# Patient Record
Sex: Female | Born: 1935 | Race: White | Hispanic: No | Marital: Single | Smoking: Never smoker
Health system: Southern US, Community
[De-identification: ages and names within clinical notes are randomized; demographics above are authoritative.]

## PROBLEM LIST (undated history)

## (undated) DIAGNOSIS — A419 Sepsis, unspecified organism: Secondary | ICD-10-CM

## (undated) DIAGNOSIS — H548 Legal blindness, as defined in USA: Secondary | ICD-10-CM

## (undated) DIAGNOSIS — I10 Essential (primary) hypertension: Secondary | ICD-10-CM

## (undated) DIAGNOSIS — H3530 Unspecified macular degeneration: Secondary | ICD-10-CM

## (undated) DIAGNOSIS — I739 Peripheral vascular disease, unspecified: Secondary | ICD-10-CM

## (undated) DIAGNOSIS — C801 Malignant (primary) neoplasm, unspecified: Secondary | ICD-10-CM

## (undated) DIAGNOSIS — I2510 Atherosclerotic heart disease of native coronary artery without angina pectoris: Secondary | ICD-10-CM

## (undated) DIAGNOSIS — E785 Hyperlipidemia, unspecified: Secondary | ICD-10-CM

## (undated) HISTORY — PX: EYE SURGERY: SHX253

## (undated) HISTORY — PX: CORONARY ANGIOPLASTY WITH STENT PLACEMENT: SHX49

## (undated) HISTORY — PX: ABDOMINAL HYSTERECTOMY: SHX81

## (undated) HISTORY — PX: APPENDECTOMY: SHX54

## (undated) HISTORY — PX: EYE MUSCLE SURGERY: SHX370

---

## 2004-02-24 ENCOUNTER — Ambulatory Visit: Payer: Self-pay | Admitting: Internal Medicine

## 2005-10-25 HISTORY — PX: CORONARY ANGIOPLASTY WITH STENT PLACEMENT: SHX49

## 2005-11-19 ENCOUNTER — Ambulatory Visit: Payer: Self-pay | Admitting: Internal Medicine

## 2006-01-12 ENCOUNTER — Encounter: Payer: Self-pay | Admitting: Internal Medicine

## 2006-01-25 ENCOUNTER — Encounter: Payer: Self-pay | Admitting: Internal Medicine

## 2006-02-17 ENCOUNTER — Ambulatory Visit: Payer: Self-pay | Admitting: Internal Medicine

## 2006-02-25 ENCOUNTER — Encounter: Payer: Self-pay | Admitting: Internal Medicine

## 2006-03-26 ENCOUNTER — Encounter: Payer: Self-pay | Admitting: Internal Medicine

## 2006-04-26 ENCOUNTER — Encounter: Payer: Self-pay | Admitting: Internal Medicine

## 2007-02-20 ENCOUNTER — Ambulatory Visit: Payer: Self-pay | Admitting: Internal Medicine

## 2009-03-24 ENCOUNTER — Ambulatory Visit: Payer: Self-pay | Admitting: Internal Medicine

## 2011-02-23 ENCOUNTER — Ambulatory Visit: Payer: Self-pay | Admitting: Internal Medicine

## 2012-07-08 ENCOUNTER — Ambulatory Visit: Payer: Self-pay | Admitting: Family Medicine

## 2012-07-08 LAB — CBC WITH DIFFERENTIAL/PLATELET
Basophil %: 1.1 %
Eosinophil #: 0.1 10*3/uL (ref 0.0–0.7)
Eosinophil %: 1.9 %
HGB: 13.9 g/dL (ref 12.0–16.0)
Lymphocyte #: 1.3 10*3/uL (ref 1.0–3.6)
Lymphocyte %: 16.6 %
MCH: 30 pg (ref 26.0–34.0)
MCHC: 34.1 g/dL (ref 32.0–36.0)
MCV: 88 fL (ref 80–100)
Neutrophil #: 5.6 10*3/uL (ref 1.4–6.5)
Neutrophil %: 73.1 %
Platelet: 222 10*3/uL (ref 150–440)
RBC: 4.65 10*6/uL (ref 3.80–5.20)
RDW: 12.9 % (ref 11.5–14.5)
WBC: 7.7 10*3/uL (ref 3.6–11.0)

## 2012-07-08 LAB — URINALYSIS, COMPLETE
Bacteria: NEGATIVE
Ketone: NEGATIVE
Nitrite: NEGATIVE
Ph: 8 (ref 4.5–8.0)
Specific Gravity: 1.005 (ref 1.003–1.030)

## 2012-07-08 LAB — COMPREHENSIVE METABOLIC PANEL
Alkaline Phosphatase: 88 U/L (ref 50–136)
BUN: 13 mg/dL (ref 7–18)
Creatinine: 0.67 mg/dL (ref 0.60–1.30)
EGFR (African American): >60
EGFR (Non-African Amer.): >60
Osmolality: 278 (ref 275–301)
SGOT(AST): 23 U/L (ref 15–37)
SGPT (ALT): 29 U/L (ref 12–78)
Sodium: 139 mmol/L (ref 136–145)

## 2012-07-14 ENCOUNTER — Ambulatory Visit: Payer: Self-pay | Admitting: Emergency Medicine

## 2013-02-23 ENCOUNTER — Ambulatory Visit: Payer: Self-pay | Admitting: Internal Medicine

## 2013-10-25 ENCOUNTER — Ambulatory Visit: Payer: Self-pay | Admitting: Ophthalmology

## 2013-10-25 LAB — POTASSIUM: Potassium: 3.3 mmol/L — ABNORMAL LOW (ref 3.5–5.1)

## 2013-11-01 ENCOUNTER — Ambulatory Visit: Payer: Self-pay | Admitting: Ophthalmology

## 2014-05-18 NOTE — Op Note (Signed)
PATIENT NAME:  Janet Barker, MUSICK MR#:  [id number] DATE OF BIRTH:  [birth year]  DATE OF PROCEDURE:  11/01/2013  This is an 81 year old female who presented for cataract surgery in the right eye.  The diagnosis code is cataract in the right eye which is N25.11.    PREOPERATIVE DIAGNOSIS:  Nuclear sclerotic cataract, right eye.  POSTOPERATIVE DIAGNOSIS:  Nuclear sclerotic cataract, right eye.  PROCEDURE:  Phacoemulsification with posterior chamber intraocular lens right eye, model SN60WF  SURGEON:  Lyla Glassing, MD  INDICATIONS:  This is an 81 year old female with decreased vision in the right eye.  PROCEDURE:  The risks and benefits of cataract surgery were discussed at length with the patient, including bleeding, infection, retinal detachment, re-operation, diplopia, ptosis, loss of vision, and loss of the eye. Informed consent was obtained. On the day of surgery, several sets of preoperative medication were administered to the operative eye including 0.5% tetracaine,1% cyclopentolate, 10% phenylephrine, 0.5% ketorolac, 0.5% gatifloxacin, and 2% lidocaine .  The patient was taken to the operating room and sedated via IV sedation. Topical tetracaine was placed in the eye. The operative eye was prepped using a 10% Betadine solution and then covered in sterile drapes leaving only the operative eye exposed. A Lieberman lid speculum was placed to provide exposure. Using 0.12 forceps and a sideport blade, a paracentesis was created. Then a mixture of BSS, preservative free lidocaine, and epinephrine was injected into the anterior chamber. Next, a 2.4 mm keratome blade was used to create a two-step full-thickness clear corneal incision temporally. The cystitome and Utrata forceps were used to create a continuous capsulorrhexis in the anterior lens capsule. BSS on a hydrodissection cannula was used to perform gentle hydrodissection. Phacoemulsification was then performed to remove the nucleus. Irrigation and  aspiration was performed to remove the remaining cortical material. Provisc was injected to fill the capsular bag and anterior chamber. A 22.5 diopter SN60WF intraocular lens was injected into the capsular bag. The Connor wand was used to rotate it into proper position in the capsular bag. Irrigation and aspiration was performed to remove the remaining Viscoelastic material from the eye. BSS on a 30-gauge cannula was used to hydrate the wound. An intracameral antibiotic was administered. The wounds were checked and found to be watertight. The lid speculum and drapes were carefully removed. Several drops of Vigamox were placed in the operative eye. The eye was covered with protective eyewear. The patient was taken to the recovery area in good condition. There were no complications.   ____________________________ Lyla Glassing, MD nm:JT D: 11/01/2013 09:43:27 ET T: 11/01/2013 13:14:03 ET JOB#: [id number]  cc: Lyla Glassing, MD, <Dictator> Lyla Glassing MD ELECTRONICALLY SIGNED 11/02/2013 9:30

## 2015-02-11 ENCOUNTER — Other Ambulatory Visit: Payer: Self-pay | Admitting: Internal Medicine

## 2015-02-11 DIAGNOSIS — Z1231 Encounter for screening mammogram for malignant neoplasm of breast: Secondary | ICD-10-CM

## 2015-03-05 ENCOUNTER — Ambulatory Visit
Admission: RE | Admit: 2015-03-05 | Discharge: 2015-03-05 | Disposition: A | Discharge status: Home / Self Care | Payer: Medicare Other | Source: Ambulatory Visit | Attending: Internal Medicine | Admitting: Internal Medicine

## 2015-03-05 DIAGNOSIS — Z1231 Encounter for screening mammogram for malignant neoplasm of breast: Secondary | ICD-10-CM | POA: Diagnosis present

## 2015-03-05 HISTORY — DX: Malignant (primary) neoplasm, unspecified: C80.1

## 2015-05-01 ENCOUNTER — Observation Stay
Admission: EM | Admit: 2015-05-01 | Discharge: 2015-05-02 | Disposition: A | Discharge status: Home / Self Care | Payer: Medicare Other | Attending: Internal Medicine | Admitting: Internal Medicine

## 2015-05-01 ENCOUNTER — Observation Stay: Payer: Medicare Other

## 2015-05-01 ENCOUNTER — Emergency Department: Payer: Medicare Other

## 2015-05-01 DIAGNOSIS — G459 Transient cerebral ischemic attack, unspecified: Secondary | ICD-10-CM | POA: Diagnosis not present

## 2015-05-01 DIAGNOSIS — R55 Syncope and collapse: Secondary | ICD-10-CM | POA: Diagnosis not present

## 2015-05-01 DIAGNOSIS — R531 Weakness: Secondary | ICD-10-CM | POA: Diagnosis not present

## 2015-05-01 DIAGNOSIS — I10 Essential (primary) hypertension: Secondary | ICD-10-CM | POA: Insufficient documentation

## 2015-05-01 DIAGNOSIS — I639 Cerebral infarction, unspecified: Secondary | ICD-10-CM

## 2015-05-01 DIAGNOSIS — H548 Legal blindness, as defined in USA: Secondary | ICD-10-CM | POA: Insufficient documentation

## 2015-05-01 DIAGNOSIS — Z8249 Family history of ischemic heart disease and other diseases of the circulatory system: Secondary | ICD-10-CM | POA: Insufficient documentation

## 2015-05-01 DIAGNOSIS — Z79899 Other long term (current) drug therapy: Secondary | ICD-10-CM | POA: Diagnosis not present

## 2015-05-01 DIAGNOSIS — I2510 Atherosclerotic heart disease of native coronary artery without angina pectoris: Secondary | ICD-10-CM | POA: Insufficient documentation

## 2015-05-01 DIAGNOSIS — H3530 Unspecified macular degeneration: Secondary | ICD-10-CM | POA: Diagnosis not present

## 2015-05-01 DIAGNOSIS — R4781 Slurred speech: Secondary | ICD-10-CM | POA: Insufficient documentation

## 2015-05-01 DIAGNOSIS — R200 Anesthesia of skin: Secondary | ICD-10-CM | POA: Insufficient documentation

## 2015-05-01 DIAGNOSIS — Z955 Presence of coronary angioplasty implant and graft: Secondary | ICD-10-CM | POA: Insufficient documentation

## 2015-05-01 DIAGNOSIS — R29898 Other symptoms and signs involving the musculoskeletal system: Secondary | ICD-10-CM | POA: Diagnosis present

## 2015-05-01 DIAGNOSIS — G451 Carotid artery syndrome (hemispheric): Secondary | ICD-10-CM | POA: Diagnosis present

## 2015-05-01 DIAGNOSIS — E785 Hyperlipidemia, unspecified: Secondary | ICD-10-CM | POA: Insufficient documentation

## 2015-05-01 DIAGNOSIS — Z7982 Long term (current) use of aspirin: Secondary | ICD-10-CM | POA: Diagnosis not present

## 2015-05-01 HISTORY — DX: Essential (primary) hypertension: I10

## 2015-05-01 HISTORY — DX: Legal blindness, as defined in USA: H54.8

## 2015-05-01 HISTORY — DX: Unspecified macular degeneration: H35.30

## 2015-05-01 HISTORY — DX: Atherosclerotic heart disease of native coronary artery without angina pectoris: I25.10

## 2015-05-01 LAB — GLUCOSE, CAPILLARY: GLUCOSE-CAPILLARY: 110 mg/dL — AB (ref 65–99)

## 2015-05-01 LAB — COMPREHENSIVE METABOLIC PANEL
ALK PHOS: 78 U/L (ref 38–126)
ALT: 17 U/L (ref 14–54)
ANION GAP: 4 — AB (ref 5–15)
AST: 26 U/L (ref 15–41)
Albumin: 4.2 g/dL (ref 3.5–5.0)
BILIRUBIN TOTAL: 0.7 mg/dL (ref 0.3–1.2)
BUN: 16 mg/dL (ref 6–20)
CALCIUM: 9.7 mg/dL (ref 8.9–10.3)
CO2: 27 mmol/L (ref 22–32)
CREATININE: 0.66 mg/dL (ref 0.44–1.00)
Chloride: 103 mmol/L (ref 101–111)
Glucose, Bld: 114 mg/dL — ABNORMAL HIGH (ref 65–99)
Potassium: 3.7 mmol/L (ref 3.5–5.1)
Sodium: 134 mmol/L — ABNORMAL LOW (ref 135–145)
TOTAL PROTEIN: 6.9 g/dL (ref 6.5–8.1)

## 2015-05-01 LAB — CBC
HCT: 38.5 % (ref 35.0–47.0)
Hemoglobin: 13.3 g/dL (ref 12.0–16.0)
MCH: 30.1 pg (ref 26.0–34.0)
MCHC: 34.4 g/dL (ref 32.0–36.0)
MCV: 87.3 fL (ref 80.0–100.0)
PLATELETS: 217 10*3/uL (ref 150–440)
RBC: 4.41 MIL/uL (ref 3.80–5.20)
RDW: 12.9 % (ref 11.5–14.5)
WBC: 5.8 10*3/uL (ref 3.6–11.0)

## 2015-05-01 LAB — DIFFERENTIAL
BASOS PCT: 1 %
Basophils Absolute: 0.1 10*3/uL (ref 0–0.1)
EOS ABS: 0.2 10*3/uL (ref 0–0.7)
Eosinophils Relative: 4 %
Lymphocytes Relative: 17 %
Lymphs Abs: 1 10*3/uL (ref 1.0–3.6)
MONO ABS: 0.5 10*3/uL (ref 0.2–0.9)
MONOS PCT: 9 %
NEUTROS ABS: 4 10*3/uL (ref 1.4–6.5)
Neutrophils Relative %: 69 %

## 2015-05-01 LAB — URINALYSIS COMPLETE WITH MICROSCOPIC (ARMC ONLY)
BILIRUBIN URINE: NEGATIVE
Bacteria, UA: NONE SEEN
Glucose, UA: NEGATIVE mg/dL
Hgb urine dipstick: NEGATIVE
KETONES UR: NEGATIVE mg/dL
Leukocytes, UA: NEGATIVE
Nitrite: NEGATIVE
PROTEIN: NEGATIVE mg/dL
SQUAMOUS EPITHELIAL / LPF: NONE SEEN
Specific Gravity, Urine: 1.003 — ABNORMAL LOW (ref 1.005–1.030)
pH: 8 (ref 5.0–8.0)

## 2015-05-01 LAB — PROTIME-INR
INR: 1.03
PROTHROMBIN TIME: 13.7 s (ref 11.4–15.0)

## 2015-05-01 LAB — APTT: APTT: 24 s (ref 24–36)

## 2015-05-01 LAB — TROPONIN I: Troponin I: <0.03 ng/mL (ref <0.031)

## 2015-05-01 LAB — ETHANOL: Alcohol, Ethyl (B): <5 mg/dL (ref <5)

## 2015-05-01 MED ORDER — ENOXAPARIN SODIUM 40 MG/0.4ML ~~LOC~~ SOLN
40.0000 mg | SUBCUTANEOUS | Status: DC
  Administered 2015-05-01: 40 mg via SUBCUTANEOUS
  Filled 2015-05-01: qty 0.4

## 2015-05-01 MED ORDER — ASPIRIN-DIPYRIDAMOLE ER 25-200 MG PO CP12
1.0000 | ORAL_CAPSULE | Freq: Two times a day (BID) | ORAL | Status: DC
  Administered 2015-05-01 – 2015-05-02 (×2): 1 via ORAL
  Filled 2015-05-01 (×2): qty 1

## 2015-05-01 MED ORDER — HYDROCHLOROTHIAZIDE 12.5 MG PO CAPS
12.5000 mg | ORAL_CAPSULE | Freq: Every day | ORAL | Status: DC
  Administered 2015-05-02: 10:00:00 12.5 mg via ORAL
  Filled 2015-05-01: qty 1

## 2015-05-01 MED ORDER — MAGNESIUM SULFATE 2 GM/50ML IV SOLN
2.0000 g | Freq: Once | INTRAVENOUS | Status: AC
  Administered 2015-05-01: 14:00:00 2 g via INTRAVENOUS
  Filled 2015-05-01: qty 50

## 2015-05-01 MED ORDER — ACETAMINOPHEN 650 MG RE SUPP: 650.0000 mg | Freq: Four times a day (QID) | RECTAL | Status: DC | PRN

## 2015-05-01 MED ORDER — ACETAMINOPHEN 325 MG PO TABS
650.0000 mg | ORAL_TABLET | Freq: Four times a day (QID) | ORAL | Status: DC | PRN
  Administered 2015-05-02: 650 mg via ORAL
  Filled 2015-05-01: qty 2

## 2015-05-01 MED ORDER — LISINOPRIL 20 MG PO TABS
40.0000 mg | ORAL_TABLET | Freq: Every day | ORAL | Status: DC
  Administered 2015-05-02: 40 mg via ORAL
  Filled 2015-05-01: qty 2

## 2015-05-01 MED ORDER — ASPIRIN 81 MG PO CHEW
324.0000 mg | CHEWABLE_TABLET | Freq: Once | ORAL | Status: AC
  Administered 2015-05-01: 324 mg via ORAL
  Filled 2015-05-01: qty 4

## 2015-05-01 MED ORDER — ASPIRIN EC 81 MG PO TBEC: 81.0000 mg | DELAYED_RELEASE_TABLET | Freq: Every day | ORAL | Status: DC

## 2015-05-01 MED ORDER — AMLODIPINE BESYLATE 5 MG PO TABS
5.0000 mg | ORAL_TABLET | Freq: Two times a day (BID) | ORAL | Status: DC
  Administered 2015-05-01 – 2015-05-02 (×2): 5 mg via ORAL
  Filled 2015-05-01 (×2): qty 1

## 2015-05-01 MED ORDER — POTASSIUM CHLORIDE CRYS ER 20 MEQ PO TBCR
40.0000 meq | EXTENDED_RELEASE_TABLET | Freq: Once | ORAL | Status: AC
  Administered 2015-05-01: 14:00:00 40 meq via ORAL
  Filled 2015-05-01: qty 2

## 2015-05-01 MED ORDER — POTASSIUM CHLORIDE CRYS ER 10 MEQ PO TBCR
10.0000 meq | EXTENDED_RELEASE_TABLET | Freq: Two times a day (BID) | ORAL | Status: DC
  Administered 2015-05-01 – 2015-05-02 (×2): 10 meq via ORAL
  Filled 2015-05-01 (×5): qty 1

## 2015-05-01 MED ORDER — SODIUM CHLORIDE 0.9% FLUSH
3.0000 mL | Freq: Two times a day (BID) | INTRAVENOUS | Status: DC
Start: 2015-05-01 — End: 2015-05-02
  Administered 2015-05-01 – 2015-05-02 (×3): 3 mL via INTRAVENOUS

## 2015-05-01 MED ORDER — LORAZEPAM 2 MG/ML IJ SOLN
0.5000 mg | Freq: Once | INTRAMUSCULAR | Status: AC
  Administered 2015-05-01: 15:00:00 0.5 mg via INTRAVENOUS
  Filled 2015-05-01: qty 1

## 2015-05-01 NOTE — ED Notes (Signed)
Pt to CT 0930 Neurologist at bedside 0934 PT back to room [phone]

## 2015-05-01 NOTE — Progress Notes (Signed)
Dr Earleen Newport made aware of 4 beat run of vtach reported by centralized monitoring, acknowledged, no new orders

## 2015-05-01 NOTE — Progress Notes (Signed)
PT Cancellation Note  Patient Details Name: Janet Barker MRN: [id number] DOB: 1924-01-29   Cancelled Treatment:    Reason Eval/Treat Not Completed: Patient at procedure or test/unavailable. Pt's chart reviewed. Upon PT's arrival pt is off the floor for an MRI. PT will f/u at a later time and complete evaluation.   Neoma Laming, PT, DPT  05/01/2015, 3:30 PM [phone]

## 2015-05-01 NOTE — ED Provider Notes (Signed)
[facility] Emergency Department Provider Note  ____________________________________________  Time seen: Approximately 9:47 AM  I have reviewed the triage vital signs and the nursing notes.   HISTORY  Chief Complaint Extremity Weakness  EM caveat: Limited by acuity of acute neurologic concern  HPI Janet Barker is a 82 y.o. female presents for evaluation of sudden weakness in the right leg.  Patient reports sudden onset at about 7:15 this morning after she got up where she became weak in the right leg, as if "it didn't want to move" as well as noted that her "speech was funny". This lasted for approximately one hour or so, and she reports it start to get better when she called 911. At the present time she reports her symptoms are back to normal, she is no longer having any concerns.  No chest pain and no shortness of breath. No fevers chills or recent illness. She has been having some "sciatica" in her right leg, but she states this is very different because the right leg just would not work at all.   Past Medical History  Diagnosis Date  . Cancer ([place])     skin ca  . Legally blind     There are no active problems to display for this patient.   Past Surgical History  Procedure Laterality Date  . Abdominal hysterectomy    . Coronary angioplasty with stent placement      Current Outpatient Rx  Name  Route  Sig  Dispense  Refill  . amLODipine (NORVASC) 5 MG tablet   Oral   Take 1 tablet by mouth 2 (two) times daily.      2   . aspirin EC 81 MG tablet   Oral   Take 81 mg by mouth daily.         . hydrochlorothiazide (MICROZIDE) 12.5 MG capsule   Oral   Take 1 capsule by mouth daily.      0   . KLOR-CON 10 10 MEQ tablet   Oral   Take 10 mEq by mouth 2 (two) times daily.       3     Dispense as written.   Marland Kitchen lisinopril (PRINIVIL,ZESTRIL) 40 MG tablet   Oral   Take 1 tablet by mouth daily.      3     Allergies Statins and  Sulfa antibiotics  Family History  Problem Relation Age of Onset  . Breast cancer Sister 68    Social History Social History  Substance Use Topics  . Smoking status: Never Smoker   . Smokeless tobacco: None  . Alcohol Use: No    Review of Systems Constitutional: No fever/chills Eyes: No visual changes.Reports she is blind in both eyes legally due to degenerative change. ENT: No sore throat. Cardiovascular: Denies chest pain. Respiratory: Denies shortness of breath. Gastrointestinal: No abdominal pain.  No nausea, no vomiting.  No diarrhea.  No constipation. Genitourinary: Negative for dysuria. Musculoskeletal: Negative for back pain. Skin: Negative for rash. Neurological: Negative for headaches, but see history of present illness.  10-point ROS otherwise negative.  ____________________________________________   PHYSICAL EXAM:  VITAL SIGNS: ED Triage Vitals  Enc Vitals Group     BP 05/01/15 0922 170/107 mmHg     Pulse Rate 05/01/15 0924 84     Resp 05/01/15 0928 22     Temp 05/01/15 0928 98.1 F (36.7 C)     Temp Source 05/01/15 0928 Oral     SpO2 05/01/15  0924 99 %     Weight --      Height --      Head Cir --      Peak Flow --      Pain Score --      Pain Loc --      Pain Edu? --      Excl. in [place]? --    Constitutional: Alert and oriented. Well appearing and in no acute distress. Eyes: Conjunctivae are normal. PERRL. EOMI. Head: Atraumatic. Nose: No congestion/rhinnorhea. Mouth/Throat: Mucous membranes are moist.  Oropharynx non-erythematous. Neck: No stridor.   Cardiovascular: Slightly irregular rhythm at times. Grossly normal heart sounds.  Good peripheral circulation. Respiratory: Normal respiratory effort.  No retractions. Lungs CTAB. Gastrointestinal: Soft and nontender. No distention. No abdominal bruits. No CVA tenderness. Musculoskeletal: No lower extremity tenderness nor edema.  No joint effusions. Neurologic:  Normal speech and language. No  gross focal neurologic deficits are appreciated.  Able to ambulate back and forth the bathroom without difficulty.  NIH score equals 0, performed by me at bedside. The patient has no pronator drift. The patient has normal cranial nerve exam. Extraocular movements are normal. Visual fields are reduced bilaterally, patient states this is chronic. Patient has 5 out of 5 strength in all extremities. There is no numbness or gross, acute sensory abnormality in the extremities bilaterally. No speech disturbance. No dysarthria. No aphasia. No ataxia. Normal finger nose finger bilat. Patient speaking in full and clear sentences.    Skin:  Skin is warm, dry and intact. No rash noted. Psychiatric: Mood and affect are normal. Speech and behavior are normal.  ____________________________________________   LABS (all labs ordered are listed, but only abnormal results are displayed)  Labs Reviewed  URINALYSIS COMPLETEWITH MICROSCOPIC ([facility] ONLY) - Abnormal; Notable for the following:    Color, Urine STRAW (*)    APPearance CLEAR (*)    Specific Gravity, Urine 1.003 (*)    All other components within normal limits  COMPREHENSIVE METABOLIC PANEL - Abnormal; Notable for the following:    Sodium 134 (*)    Glucose, Bld 114 (*)    Anion gap 4 (*)    All other components within normal limits  GLUCOSE, CAPILLARY - Abnormal; Notable for the following:    Glucose-Capillary 110 (*)    All other components within normal limits  CBC  ETHANOL  PROTIME-INR  APTT  DIFFERENTIAL  TROPONIN I   ____________________________________________  EKG  Reviewed and interpreted by me at 9:30 AM Normal sinus rhythm Rate 75 QRS 110 QTc 420 PR 220 Occasional PVC. No evidence of acute ischemic abnormality ____________________________________________  RADIOLOGY  CT Head Wo Contrast (Final result) Result time: 05/01/15 09:48:42   Final result by Rad Results In Interface (05/01/15 09:48:42)    Narrative:   CLINICAL DATA: RIGHT-sided weakness the began at 0700 hours, RIGHT leg became weak when up making coffee this morning, RIGHT leg has not felt RIGHT for 1 month but was weaker this morning, no arm weakness, code stroke  EXAM: CT HEAD WITHOUT CONTRAST  TECHNIQUE: Contiguous axial images were obtained from the base of the skull through the vertex without intravenous contrast.  COMPARISON: 07/08/2012  FINDINGS: Mild age-related atrophy.  Normal ventricular morphology.  No midline shift or mass effect.  Minimal small vessel chronic ischemic changes of deep cerebral white matter.  No intracranial hemorrhage, mass lesion or evidence acute infarction.  No extra-axial fluid collections.  Visualized paranasal sinuses and mastoid air cells clear.  Bones demineralized.  IMPRESSION: Minimal small vessel chronic ischemic changes of deep cerebral white matter.  No acute intracranial abnormalities.  Findings called to Dr. Jacqualine Code on 05/01/2015 at 0944 hr.   Electronically Signed By: Lavonia Dana M.D. On: 05/01/2015 09:48    ____________________________________________   PROCEDURES  Procedure(s) performed: None  Critical Care performed: Yes, see critical care note(s)  CRITICAL CARE Performed by: Delman Kitten   Total critical care time: 35 minutes  Critical care time was exclusive of separately billable procedures and treating other patients.  Critical care was necessary to treat or prevent imminent or life-threatening deterioration.  Critical care was time spent personally by me on the following activities: development of treatment plan with patient and/or surrogate as well as nursing, discussions with consultants, evaluation of patient's response to treatment, examination of patient, obtaining history from patient or surrogate, ordering and performing treatments and interventions, ordering and review of laboratory studies, ordering and review of  radiographic studies, pulse oximetry and re-evaluation of patient's condition.  Given patient's symptoms, code stroke was called and TPA was considered that her symptoms have resolved rapidly, no longer making her a candidate. ____________________________________________   INITIAL IMPRESSION / ASSESSMENT AND PLAN / ED COURSE  Pertinent labs & imaging results that were available during my care of the patient were reviewed by me and considered in my medical decision making (see chart for details).  Patient presents with sudden and acute inability to move the right leg as well as a speech change. This improved drastically and she reports she is feeling back to normal on ER arrival.  No acute cardiopulmonary symptoms. No recent illness. She does report no headache, but she does report neurologic symptoms which are now improved. Presently no obvious clear focal deficit. Seen by Dr. Doy Mince and the stroke team, decision made TPA not required but will give aspirin. Admitted to the hospital for further evaluation. ____________________________________________   FINAL CLINICAL IMPRESSION(S) / ED DIAGNOSES  Final diagnoses:  Hemispheric carotid artery syndrome      Delman Kitten, MD 05/01/15 1057

## 2015-05-01 NOTE — Plan of Care (Signed)
Problem: Safety: Goal: Ability to remain free from injury will improve Outcome: Progressing Pt with no noted complaints, no distress or discomfort noted, voices any needs

## 2015-05-01 NOTE — Care Management Obs Status (Signed)
[facility] NOTIFICATION   Patient Details  Name: KINZY LEIDECKER MRN: [id number] Date of Birth: 24-Feb-1924   Medicare Observation Status Notification Given:  Yes    Beau Fanny, RN 05/01/2015, 11:50 AM

## 2015-05-01 NOTE — ED Notes (Signed)
Pt up to bathroom to collect urine sample, pt missed hat in toilet. MD at bedside.

## 2015-05-01 NOTE — ED Notes (Signed)
Pt alert and oriented X4, active, cooperative, pt in NAD. RR even and unlabored, color WNL. Pt taken to floor by ED Tech.

## 2015-05-01 NOTE — H&P (Signed)
[place] [facility] [place] NAME: Janet Barker    MR#:  [id number]  DATE OF BIRTH:  1924/02/28  DATE OF ADMISSION:  05/01/2015  PRIMARY CARE PHYSICIAN: Ezequiel Kayser, MD   REQUESTING/REFERRING PHYSICIAN: Dr. Delman Kitten  CHIEF COMPLAINT:   Chief Complaint  Patient presents with  . Extremity Weakness    HISTORY OF PRESENT ILLNESS:  Janet Barker  is a 82 y.o. female with a known history of CAD, hypertension. She presents with right lower extremity weakness. She got up this morning she didn't feel right with the right leg. She went into the kitchen to make some coffee and breakfast. Then she went to the bathroom. And then when she came out into the hallway she felt like she couldn't move her right leg. She was able to get to the phone and call EMS. She also felt some numbness down the right leg. Also had some numbness in the right face. And she felt like she was unable to talk very well. Patient seen in the ER by neurology and recommended a stroke workup.  PAST MEDICAL HISTORY:   Past Medical History  Diagnosis Date  . Cancer ([place])     skin ca  . Legally blind   . Macular degeneration   . Coronary artery disease   . Hypertension     PAST SURGICAL HISTORY:   Past Surgical History  Procedure Laterality Date  . Abdominal hysterectomy    . Coronary angioplasty with stent placement    . Eye muscle surgery    . Eye surgery      SOCIAL HISTORY:   Social History  Substance Use Topics  . Smoking status: Never Smoker   . Smokeless tobacco: Not on file  . Alcohol Use: No    FAMILY HISTORY:   Family History  Problem Relation Age of Onset  . Breast cancer Sister 56  . Hypertension Mother   . CAD Father   . Congestive Heart Failure Brother   . Congestive Heart Failure Brother   . CAD Brother     DRUG ALLERGIES:   Allergies  Allergen Reactions  . Statins Other (See Comments)    Reaction: Muscle Pains  . Sulfa Antibiotics  Anxiety    REVIEW OF SYSTEMS:  CONSTITUTIONAL: No fever, right leg weakness.  EYES: Legally blind with macular degeneration EARS, NOSE, AND THROAT: No tinnitus or ear pain. No sore throat RESPIRATORY: No cough, shortness of breath, wheezing or hemoptysis.  CARDIOVASCULAR: No chest pain, orthopnea, edema.  GASTROINTESTINAL: No nausea, vomiting, diarrhea or abdominal pain. No blood in bowel movements GENITOURINARY: No dysuria, hematuria.  ENDOCRINE: No polyuria, nocturia,  HEMATOLOGY: No anemia, easy bruising or bleeding SKIN: No rash or lesion. MUSCULOSKELETAL: No joint pain or arthritis.   NEUROLOGIC: Right leg weakness right facial and right leg numbness.  PSYCHIATRY: No anxiety or depression.   MEDICATIONS AT HOME:   Prior to Admission medications   Medication Sig Start Date End Date Taking? Authorizing Provider  amLODipine (NORVASC) 5 MG tablet Take 1 tablet by mouth 2 (two) times daily. 04/13/15  Yes Historical Provider, MD  aspirin EC 81 MG tablet Take 81 mg by mouth daily.   Yes Historical Provider, MD  hydrochlorothiazide (MICROZIDE) 12.5 MG capsule Take 1 capsule by mouth daily. 02/18/15  Yes Historical Provider, MD  KLOR-CON 10 10 MEQ tablet Take 10 mEq by mouth 2 (two) times daily.  04/25/15  Yes Historical Provider, MD  lisinopril (PRINIVIL,ZESTRIL) 40 MG  tablet Take 1 tablet by mouth daily. 04/24/15  Yes Historical Provider, MD      VITAL SIGNS:  Blood pressure 157/98, pulse 69, temperature 98.1 F (36.7 C), temperature source Oral, resp. rate 18, height 5\' 2"  (1.575 m), weight 67.699 kg (149 lb 4 oz), SpO2 100 %.  PHYSICAL EXAMINATION:  GENERAL:  82 y.o.-year-old patient lying in the bed with no acute distress.  EYES: Pupils equal, round, reactive to light and accommodation. No scleral icterus. Extraocular muscles intact.  HEENT: Head atraumatic, normocephalic. Oropharynx and nasopharynx clear.  NECK:  Supple, no jugular venous distention. No thyroid enlargement, no  tenderness.  LUNGS: Normal breath sounds bilaterally, no wheezing, rales,rhonchi or crepitation. No use of accessory muscles of respiration.  CARDIOVASCULAR: S1, S2 normal. No murmurs, rubs, or gallops.  ABDOMEN: Soft, nontender, nondistended. Bowel sounds present. No organomegaly or mass.  EXTREMITIES: No pedal edema, cyanosis, or clubbing.  NEUROLOGIC: Cranial nerves II through XII are intact. Muscle strength 5/5 in all extremities. Sensation intact. Gait not checked.  PSYCHIATRIC: The patient is alert and oriented x 3.  SKIN: No rash, lesion, or ulcer.   LABORATORY PANEL:   CBC  Recent Labs Lab 05/01/15 0939  WBC 5.8  HGB 13.3  HCT 38.5  PLT 217   ------------------------------------------------------------------------------------------------------------------  Chemistries   Recent Labs Lab 05/01/15 0939  NA 134*  K 3.7  CL 103  CO2 27  GLUCOSE 114*  BUN 16  CREATININE 0.66  CALCIUM 9.7  AST 26  ALT 17  ALKPHOS 78  BILITOT 0.7   ------------------------------------------------------------------------------------------------------------------  Cardiac Enzymes  Recent Labs Lab 05/01/15 0939  TROPONINI <0.03   ------------------------------------------------------------------------------------------------------------------  RADIOLOGY:  Ct Head Wo Contrast  05/01/2015  CLINICAL DATA:  RIGHT-sided weakness the began at 0700 hours, RIGHT leg became weak when up making coffee this morning, RIGHT leg has not felt RIGHT for 1 month but was weaker this morning, no arm weakness, code stroke EXAM: CT HEAD WITHOUT CONTRAST TECHNIQUE: Contiguous axial images were obtained from the base of the skull through the vertex without intravenous contrast. COMPARISON:  07/08/2012 FINDINGS: Mild age-related atrophy. Normal ventricular morphology. No midline shift or mass effect. Minimal small vessel chronic ischemic changes of deep cerebral white matter. No intracranial hemorrhage,  mass lesion or evidence acute infarction. No extra-axial fluid collections. Visualized paranasal sinuses and mastoid air cells clear. Bones demineralized. IMPRESSION: Minimal small vessel chronic ischemic changes of deep cerebral white matter. No acute intracranial abnormalities. Findings called to Dr. Jacqualine Code on 05/01/2015 at 0944 hr. Electronically Signed   By: Lavonia Dana M.D.   On: 05/01/2015 09:48    EKG:   Normal sinus rhythm 76 bpm, PVCs, LVH  IMPRESSION AND PLAN:   1. Transient ischemic attack with right leg weakness. Patient's strength is 5 out of 5. Patient hesitant on taking a step up to Plavix at this point because her eye doctor told her there was some bleeding in the back of the eye when she was on Plavix in the past. Take a step up to Aggrenox. MRI of the brain and MRA of the brain carotid ultrasound and echocardiogram ordered. Physical therapy ordered. Patient has an allergy to statins 2. Essential hypertension patient took her blood pressure medications this morning blood pressure in the ER initially was high but now better. 3. History of coronary artery disease 4. Macular degeneration and legally blind  All the records are reviewed and case discussed with ED provider. Management plans discussed with the patient, family and  they are in agreement.  CODE STATUS: Full code  TOTAL TIME TAKING CARE OF THIS PATIENT: 50 minutes.    Loletha Grayer M.D on 05/01/2015 at 12:23 PM  Between 7am to 6pm - Pager - [phone]  After 6pm call admission pager [phone]  [employer] [facility] [facility]  [phone]  CC: Primary care physician; Ezequiel Kayser, MD

## 2015-05-01 NOTE — Consult Note (Signed)
Referring Physician: Quale    Chief Complaint: Right leg weakness, slurred speech  HPI: Janet Barker is an 82 y.o. female who reports waking up normal today.  While in the bathroom had the onset of inability to use the right leg.  Had slurred speech as well.  EMS was called.  On arrival with some mild RLE weakness that persisted but symptoms currently have resolved.  Initial NIHSS of 0.    Date last known well: 05/01/2015 Time last known well: Time: 07:00 tPA Given: No: Resolution of symptoms  MRankin: 0  Past Medical History  Diagnosis Date  . Cancer ([place])     skin ca  HTN, Hyperlipidemia, PVD, CAD  Past Surgical History  Procedure Laterality Date  . Abdominal hysterectomy      Family History  Problem Relation Age of Onset  . Breast cancer Sister 98   Father died of sudden death-unknown cause.  Mother died at 18 of a stroke.    Social History:  has no tobacco, alcohol, or illicit drug abuse history  Allergies:  Allergies  Allergen Reactions  . Statins   . Sulfa Antibiotics     Medications: I have reviewed the patient's current medications. Prior to Admission:  Prior to Admission medications   Medication Sig Start Date End Date Taking? Authorizing Provider  amLODipine (NORVASC) 5 MG tablet Take 1 tablet by mouth 2 (two) times daily. 04/13/15   Historical Provider, MD  aspirin EC 81 MG tablet Take 81 mg by mouth daily.    Historical Provider, MD  hydrochlorothiazide (MICROZIDE) 12.5 MG capsule Take 1 capsule by mouth daily. 02/18/15   Historical Provider, MD  KLOR-CON 10 10 MEQ tablet Take 2 tablets by mouth daily. 04/25/15   Historical Provider, MD  lisinopril (PRINIVIL,ZESTRIL) 40 MG tablet Take 1 tablet by mouth daily. 04/24/15   Historical Provider, MD    ROS: History obtained from the patient  General ROS: negative for - chills, fatigue, fever, night sweats, weight gain or weight loss Psychological ROS: negative for - behavioral disorder, hallucinations, memory  difficulties, mood swings or suicidal ideation Ophthalmic ROS: legally blind ENT ROS: negative for - epistaxis, nasal discharge, oral lesions, sore throat, tinnitus or vertigo Allergy and Immunology ROS: negative for - hives or itchy/watery eyes Hematological and Lymphatic ROS: negative for - bleeding problems, bruising or swollen lymph nodes Endocrine ROS: negative for - galactorrhea, hair pattern changes, polydipsia/polyuria or temperature intolerance Respiratory ROS: negative for - cough, hemoptysis, shortness of breath or wheezing Cardiovascular ROS: negative for - chest pain, dyspnea on exertion, edema or irregular heartbeat Gastrointestinal ROS: negative for - abdominal pain, diarrhea, hematemesis, nausea/vomiting or stool incontinence Genito-Urinary ROS: negative for - dysuria, hematuria, incontinence or urinary frequency/urgency Musculoskeletal ROS: right hip pain radiating down the side of the leg to the ankle for the past month Neurological ROS: as noted in HPI Dermatological ROS: negative for rash and skin lesion changes  Physical Examination: Blood pressure 170/107, pulse 82, temperature 98.1 F (36.7 C), temperature source Oral, resp. rate 22, SpO2 99 %.  HEENT-  Normocephalic, no lesions, without obvious abnormality.  Normal external eye and conjunctiva.  Normal TM's bilaterally.  Normal auditory canals and external ears. Normal external nose, mucus membranes and septum.  Normal pharynx. Cardiovascular- S1, S2 normal, pulses palpable throughout   Lungs- chest clear, no wheezing, rales, normal symmetric air entry Abdomen- soft, non-tender; bowel sounds normal; no masses,  no organomegaly Extremities- no edema Lymph-no adenopathy palpable Musculoskeletal-no joint tenderness, deformity  or swelling Skin-warm and dry, no hyperpigmentation, vitiligo, or suspicious lesions  Neurological Examination Mental Status: Alert, oriented, thought content appropriate.  Speech fluent without  evidence of aphasia.  Able to follow 3 step commands without difficulty. Cranial Nerves: II: Discs flat bilaterally; Visual fields grossly normal, pupils equal, round, reactive to light and accommodation III,IV, VI: ptosis not present, extra-ocular motions intact bilaterally V,VII: smile symmetric, facial light touch sensation normal bilaterally VIII: hearing normal bilaterally IX,X: gag reflex present XI: bilateral shoulder shrug XII: midline tongue extension Motor: Right : Upper extremity   5/5    Left:     Upper extremity   5/5  Lower extremity   5/5     Lower extremity   5/5 Tone and bulk:normal tone throughout; no atrophy noted Sensory: Pinprick and light touch intact throughout, bilaterally Deep Tendon Reflexes: 2+ and symmetric throughout Plantars: Right: downgoing   Left: downgoing Cerebellar: No dysmetria noted with finger-to-nose and heel-to-shin testing Gait: not tested due to safety concerns   Laboratory Studies:  Basic Metabolic Panel: No results for input(s): NA, K, CL, CO2, GLUCOSE, BUN, CREATININE, CALCIUM, MG, PHOS in the last 168 hours.  Liver Function Tests: No results for input(s): AST, ALT, ALKPHOS, BILITOT, PROT, ALBUMIN in the last 168 hours. No results for input(s): LIPASE, AMYLASE in the last 168 hours. No results for input(s): AMMONIA in the last 168 hours.  CBC: No results for input(s): WBC, NEUTROABS, HGB, HCT, MCV, PLT in the last 168 hours.  Cardiac Enzymes: No results for input(s): CKTOTAL, CKMB, CKMBINDEX, TROPONINI in the last 168 hours.  BNP: Invalid input(s): POCBNP  CBG: No results for input(s): GLUCAP in the last 168 hours.  Microbiology: No results found for this or any previous visit.  Coagulation Studies: No results for input(s): LABPROT, INR in the last 72 hours.  Urinalysis: No results for input(s): COLORURINE, LABSPEC, PHURINE, GLUCOSEU, HGBUR, BILIRUBINUR, KETONESUR, PROTEINUR, UROBILINOGEN, NITRITE, LEUKOCYTESUR in the last  168 hours.  Invalid input(s): APPERANCEUR  Lipid Panel: No results found for: CHOL, TRIG, HDL, CHOLHDL, VLDL, LDLCALC  HgbA1C: No results found for: HGBA1C  Urine Drug Screen:  No results found for: LABOPIA, COCAINSCRNUR, LABBENZ, AMPHETMU, THCU, LABBARB  Alcohol Level: No results for input(s): ETH in the last 168 hours.   Imaging: Ct Head Wo Contrast  05/01/2015  CLINICAL DATA:  RIGHT-sided weakness the began at 0700 hours, RIGHT leg became weak when up making coffee this morning, RIGHT leg has not felt RIGHT for 1 month but was weaker this morning, no arm weakness, code stroke EXAM: CT HEAD WITHOUT CONTRAST TECHNIQUE: Contiguous axial images were obtained from the base of the skull through the vertex without intravenous contrast. COMPARISON:  07/08/2012 FINDINGS: Mild age-related atrophy. Normal ventricular morphology. No midline shift or mass effect. Minimal small vessel chronic ischemic changes of deep cerebral white matter. No intracranial hemorrhage, mass lesion or evidence acute infarction. No extra-axial fluid collections. Visualized paranasal sinuses and mastoid air cells clear. Bones demineralized. IMPRESSION: Minimal small vessel chronic ischemic changes of deep cerebral white matter. No acute intracranial abnormalities. Findings called to Dr. Jacqualine Code on 05/01/2015 at 0944 hr. Electronically Signed   By: Lavonia Dana M.D.   On: 05/01/2015 09:48    Assessment: 82 y.o. female presenting with transient complaint of RLE weakness and slurred speech.  Patient feels she is back to baseline.  Head CT personally reviewed and shows no acute changes.  Concern is for TIA.  Patient on ASA at home.  Further work  up recommended.    Stroke Risk Factors - hyperlipidemia and hypertension  Plan: 1. HgbA1c, fasting lipid panel 2. MRI, MRA  of the brain without contrast 3. PT consult, OT consult, Speech consult 4. Echocardiogram 5. Carotid dopplers 6. Prophylactic therapy-Antiplatelet med: Aspirin -  dose 325mg  today.  Would start 75mg  daily of Plavix in AM.   7. NPO until RN stroke swallow screen 8. Telemetry monitoring 9. Frequent neuro checks  Case discussed with Dr. Fortino Sic, MD Neurology [phone] 05/01/2015, 9:52 AM

## 2015-05-01 NOTE — ED Notes (Signed)
Pt on phone with MRI tech.  

## 2015-05-01 NOTE — ED Notes (Signed)
Pt arrives to ER via [facility] from home c/o right sided weakness that began at approx 7AM. Pt states that she was up and making her coffee when her right leg became weak. Pt denies fall and has been completely ambulatory since sx began. Pt states that her right leg "has not been right" X 1 month but was weaker this AM. Pt denies arm weakness. Pt ambulatory at time of arrival, no weakness to report. Pt alert and oriented X4, active, cooperative, pt in NAD. RR even and unlabored, color WNL.

## 2015-05-01 NOTE — Progress Notes (Signed)
Dr Earleen Newport made aware that pt had a 6 beat run of PVCs reported by centralized monitoring

## 2015-05-02 ENCOUNTER — Observation Stay
Admit: 2015-05-02 | Discharge: 2015-05-02 | Disposition: A | Discharge status: Home / Self Care | Payer: Medicare Other | Attending: Internal Medicine | Admitting: Internal Medicine

## 2015-05-02 DIAGNOSIS — G459 Transient cerebral ischemic attack, unspecified: Secondary | ICD-10-CM | POA: Diagnosis not present

## 2015-05-02 LAB — LIPID PANEL
Cholesterol: 207 mg/dL — ABNORMAL HIGH (ref 0–200)
HDL: 61 mg/dL (ref >40)
LDL Cholesterol: 121 mg/dL — ABNORMAL HIGH (ref 0–99)
TRIGLYCERIDES: 126 mg/dL (ref <150)
Total CHOL/HDL Ratio: 3.4 RATIO
VLDL: 25 mg/dL (ref 0–40)

## 2015-05-02 LAB — ECHOCARDIOGRAM COMPLETE
Height: 62 in
Weight: 2278.4 oz

## 2015-05-02 LAB — CBC
HEMATOCRIT: 35.4 % (ref 35.0–47.0)
Hemoglobin: 12.2 g/dL (ref 12.0–16.0)
MCH: 30.5 pg (ref 26.0–34.0)
MCHC: 34.3 g/dL (ref 32.0–36.0)
MCV: 88.9 fL (ref 80.0–100.0)
Platelets: 215 10*3/uL (ref 150–440)
RBC: 3.99 MIL/uL (ref 3.80–5.20)
RDW: 13.1 % (ref 11.5–14.5)
WBC: 8.8 10*3/uL (ref 3.6–11.0)

## 2015-05-02 LAB — BASIC METABOLIC PANEL
ANION GAP: 3 — AB (ref 5–15)
BUN: 21 mg/dL — AB (ref 6–20)
CALCIUM: 9 mg/dL (ref 8.9–10.3)
CO2: 25 mmol/L (ref 22–32)
Chloride: 107 mmol/L (ref 101–111)
Creatinine, Ser: 0.72 mg/dL (ref 0.44–1.00)
GFR calc Af Amer: >60 mL/min (ref >60)
GLUCOSE: 105 mg/dL — AB (ref 65–99)
Potassium: 3.9 mmol/L (ref 3.5–5.1)
Sodium: 135 mmol/L (ref 135–145)

## 2015-05-02 LAB — GLUCOSE, CAPILLARY: Glucose-Capillary: 115 mg/dL — ABNORMAL HIGH (ref 65–99)

## 2015-05-02 MED ORDER — ASPIRIN-DIPYRIDAMOLE ER 25-200 MG PO CP12: 1.0000 | ORAL_CAPSULE | Freq: Two times a day (BID) | ORAL | Status: DC

## 2015-05-02 NOTE — Progress Notes (Signed)
Patient became lethargic while NT was assisting her to [facility],  HR 40, RRT called, MD notified. Patient became alert after sternal rub, and was able to answer questions correctly, vital signs stable, blood sugar stable, Neuro checks WNL.

## 2015-05-02 NOTE — Discharge Summary (Signed)
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 Cancer ([place])     skin ca  . Legally blind   . Macular degeneration   . Coronary artery disease   . Hypertension     HOSPITAL COURSE:   1. Transient ischemic attack with right leg weakness.   MRI and MRA brain without any abnormalities, Carotid doppler does not show any significant blockages.  She is started on aggrenox, approved by neurologist.   She is alelrgic to statin. 2. Essential hypertension patient took her blood pressure medications this morning blood pressure in the ER initially was high but now better. Cont home meds. 3. History of coronary artery disease- aggrenox and lisinopril. 4. Macular degeneration and legally blind.  DISCHARGE CONDITIONS:   Stable.  CONSULTS OBTAINED:  Treatment Team:  Catarina Hartshorn, MD  DRUG ALLERGIES:   Allergies  Allergen Reactions  . Statins Other (See Comments)    Reaction: Muscle Pains  . Sulfa Antibiotics Anxiety    DISCHARGE MEDICATIONS:   Current Discharge Medication List    START taking these medications   Details  dipyridamole-aspirin (AGGRENOX) 200-25 MG 12hr capsule Take 1 capsule by mouth 2 (two) times daily. Qty: 60 capsule, Refills: 0      CONTINUE these medications which have NOT CHANGED   Details  amLODipine (NORVASC) 5 MG tablet Take 1 tablet by mouth 2 (two) times daily. Refills: 2    hydrochlorothiazide (MICROZIDE) 12.5 MG capsule Take 1  capsule by mouth daily. Refills: 0    KLOR-CON 10 10 MEQ tablet Take 10 mEq by mouth 2 (two) times daily.  Refills: 3    lisinopril (PRINIVIL,ZESTRIL) 40 MG tablet Take 1 tablet by mouth daily. Refills: 3      STOP taking these medications     aspirin EC 81 MG tablet          DISCHARGE INSTRUCTIONS:   Follow with cardiologist in office 2 weeks.  If you experience worsening of your admission symptoms, develop shortness of breath, life threatening emergency, suicidal or homicidal thoughts you must seek medical attention immediately by calling 911 or calling your MD immediately  if symptoms less severe.  You Must read complete instructions/literature along with all the possible adverse reactions/side effects for all the Medicines you take and that have been prescribed to you. Take any new Medicines after you have completely understood and accept all the possible adverse reactions/side effects.   Please note  You were cared for by a hospitalist during your hospital stay. If you have any questions about your discharge medications or the care you received while you were in the hospital after you are discharged, you can call the unit and asked to speak with the hospitalist on call if the hospitalist that took care of you is not available. Once you are discharged, your primary care physician will handle any further medical issues. Please note that NO REFILLS for any discharge medications will be authorized once you are discharged, as it is imperative  that you return to your primary care physician (or establish a relationship with a primary care physician if you do not have one) for your aftercare needs so that they can reassess your need for medications and monitor your lab values.    Today   CHIEF COMPLAINT:   Chief Complaint  Patient presents with  . Extremity Weakness    HISTORY OF PRESENT ILLNESS:  Janet Barker  is a 82 y.o. female with a known history of CAD, hypertension. She  presents with right lower extremity weakness. She got up this morning she didn't feel right with the right leg. She went into the kitchen to make some coffee and breakfast. Then she went to the bathroom. And then when she came out into the hallway she felt like she couldn't move her right leg. She was able to get to the phone and call EMS. She also felt some numbness down the right leg. Also had some numbness in the right face. And she felt like she was unable to talk very well. Patient seen in the ER by neurology and recommended a stroke workup.   VITAL SIGNS:  Blood pressure 131/79, pulse 79, temperature 97.7 F (36.5 C), temperature source Oral, resp. rate 18, height 5\' 2"  (1.575 m), weight 64.592 kg (142 lb 6.4 oz), SpO2 97 %.  I/O:   Intake/Output Summary (Last 24 hours) at 05/02/15 1351 Last data filed at 05/02/15 0900  Gross per 24 hour  Intake    480 ml  Output      0 ml  Net    480 ml    PHYSICAL EXAMINATION:  GENERAL:  82 y.o.-year-old patient lying in the bed with no acute distress.  EYES: Pupils equal, round, reactive to light and accommodation. No scleral icterus. Extraocular muscles intact.  HEENT: Head atraumatic, normocephalic. Oropharynx and nasopharynx clear.  NECK:  Supple, no jugular venous distention. No thyroid enlargement, no tenderness.  LUNGS: Normal breath sounds bilaterally, no wheezing, rales,rhonchi or crepitation. No use of accessory muscles of respiration.  CARDIOVASCULAR: S1, S2 normal. No murmurs, rubs, or gallops.  ABDOMEN: Soft, non-tender, non-distended. Bowel sounds present. No organomegaly or mass.  EXTREMITIES: No pedal edema, cyanosis, or clubbing.  NEUROLOGIC: Cranial nerves II through XII are intact. Muscle strength 5/5 in all extremities. Sensation intact. Gait not checked.  PSYCHIATRIC: The patient is alert and oriented x 3.  SKIN: No obvious rash, lesion, or ulcer.   DATA REVIEW:   CBC  Recent Labs Lab 05/02/15 0428  WBC 8.8  HGB 12.2   HCT 35.4  PLT 215    Chemistries   Recent Labs Lab 05/01/15 0939 05/02/15 0428  NA 134* 135  K 3.7 3.9  CL 103 107  CO2 27 25  GLUCOSE 114* 105*  BUN 16 21*  CREATININE 0.66 0.72  CALCIUM 9.7 9.0  AST 26  --   ALT 17  --   ALKPHOS 78  --   BILITOT 0.7  --     Cardiac Enzymes  Recent Labs Lab 05/01/15 0939  TROPONINI <0.03    Microbiology Results  No results found for this or any previous visit.  RADIOLOGY:  Ct Head Wo Contrast  05/01/2015  CLINICAL DATA:  RIGHT-sided weakness the began at 0700 hours, RIGHT leg became weak when up making coffee this morning, RIGHT leg has not felt RIGHT for 1 month but was weaker this morning, no arm weakness, code stroke EXAM: CT HEAD WITHOUT CONTRAST TECHNIQUE: Contiguous axial images were obtained  from the base of the skull through the vertex without intravenous contrast. COMPARISON:  07/08/2012 FINDINGS: Mild age-related atrophy. Normal ventricular morphology. No midline shift or mass effect. Minimal small vessel chronic ischemic changes of deep cerebral white matter. No intracranial hemorrhage, mass lesion or evidence acute infarction. No extra-axial fluid collections. Visualized paranasal sinuses and mastoid air cells clear. Bones demineralized. IMPRESSION: Minimal small vessel chronic ischemic changes of deep cerebral white matter. No acute intracranial abnormalities. Findings called to Dr. Jacqualine Code on 05/01/2015 at 0944 hr. Electronically Signed   By: Lavonia Dana M.D.   On: 05/01/2015 09:48   Mr Virgel Paling Wo Contrast  05/01/2015  CLINICAL DATA:  82 year old female presenting with transient RIGHT lower extremity weakness and slurred speech. Normal neurologic exam. Initial encounter. EXAM: MRI HEAD WITHOUT CONTRAST MRA HEAD WITHOUT CONTRAST TECHNIQUE: Multiplanar, multiecho pulse sequences of the brain and surrounding structures were obtained without intravenous contrast. Angiographic images of the head were obtained using MRA technique  without contrast. COMPARISON:  CT head performed earlier today. FINDINGS: MRI HEAD FINDINGS No evidence for acute infarction, hemorrhage, mass lesion, hydrocephalus, or extra-axial fluid. Mild atrophy, not unexpected for age. Mild to moderate subcortical and periventricular T2 and FLAIR hyperintensities, likely chronic microvascular ischemic change. Pituitary, pineal, and cerebellar tonsils unremarkable. No upper cervical lesions. Cervical spondylosis at C3-4, incompletely evaluated, with apparent central protrusion or disc osteophyte complex. Flow voids are maintained throughout the carotid, basilar, and vertebral arteries. There are no areas of chronic hemorrhage. Visualized calvarium, skull base, and upper cervical osseous structures unremarkable. Scalp and extracranial soft tissues, orbits, sinuses, and mastoids show no acute process. MRA HEAD FINDINGS The RIGHT internal carotid artery demonstrates moderate dolichoectasia. There is non stenotic atheromatous change at the junction of the cavernous and supraclinoid ICA. LEFT internal carotid artery is widely patent through its cervical, cavernous, and petrous segments. Long segment narrowing of the supraclinoid ICA, estimated 50%, not clearly flow reducing. Basilar artery widely patent with vertebrals codominant. Normal BILATERAL anterior cerebral arteries. Normal BILATERAL M1 MCA segments. Mild irregularity distal MCA branches consistent with intracranial atherosclerotic disease. Normal proximal posterior cerebral arteries. Mild irregularity distal PCAs suggesting intracranial atherosclerotic change. No visualized cerebellar branch occlusion. No intracranial aneurysm. IMPRESSION: No evidence for acute stroke. Atrophy with mild to moderate small vessel disease. No flow reducing intracranial stenosis or aneurysm. C3-4 cervical spondylosis, possible stenosis, incompletely evaluated. If the patient's symptoms could be related to myelopathy, consider MRI cervical  spine for further evaluation. Electronically Signed   By: Staci Righter M.D.   On: 05/01/2015 16:30   Mr Brain Wo Contrast  05/01/2015  CLINICAL DATA:  82 year old female presenting with transient RIGHT lower extremity weakness and slurred speech. Normal neurologic exam. Initial encounter. EXAM: MRI HEAD WITHOUT CONTRAST MRA HEAD WITHOUT CONTRAST TECHNIQUE: Multiplanar, multiecho pulse sequences of the brain and surrounding structures were obtained without intravenous contrast. Angiographic images of the head were obtained using MRA technique without contrast. COMPARISON:  CT head performed earlier today. FINDINGS: MRI HEAD FINDINGS No evidence for acute infarction, hemorrhage, mass lesion, hydrocephalus, or extra-axial fluid. Mild atrophy, not unexpected for age. Mild to moderate subcortical and periventricular T2 and FLAIR hyperintensities, likely chronic microvascular ischemic change. Pituitary, pineal, and cerebellar tonsils unremarkable. No upper cervical lesions. Cervical spondylosis at C3-4, incompletely evaluated, with apparent central protrusion or disc osteophyte complex. Flow voids are maintained throughout the carotid, basilar, and vertebral arteries. There are no areas of chronic hemorrhage. Visualized calvarium, skull base, and upper cervical osseous structures  unremarkable. Scalp and extracranial soft tissues, orbits, sinuses, and mastoids show no acute process. MRA HEAD FINDINGS The RIGHT internal carotid artery demonstrates moderate dolichoectasia. There is non stenotic atheromatous change at the junction of the cavernous and supraclinoid ICA. LEFT internal carotid artery is widely patent through its cervical, cavernous, and petrous segments. Long segment narrowing of the supraclinoid ICA, estimated 50%, not clearly flow reducing. Basilar artery widely patent with vertebrals codominant. Normal BILATERAL anterior cerebral arteries. Normal BILATERAL M1 MCA segments. Mild irregularity distal MCA  branches consistent with intracranial atherosclerotic disease. Normal proximal posterior cerebral arteries. Mild irregularity distal PCAs suggesting intracranial atherosclerotic change. No visualized cerebellar branch occlusion. No intracranial aneurysm. IMPRESSION: No evidence for acute stroke. Atrophy with mild to moderate small vessel disease. No flow reducing intracranial stenosis or aneurysm. C3-4 cervical spondylosis, possible stenosis, incompletely evaluated. If the patient's symptoms could be related to myelopathy, consider MRI cervical spine for further evaluation. Electronically Signed   By: Staci Righter M.D.   On: 05/01/2015 16:30   US Carotid Bilateral  05/01/2015  CLINICAL DATA:  82 year old female with a history of leg weakness. Cardiovascular risk factors include hypertension, known coronary artery disease, hyperlipidemia, known vascular disease with prior vascular surgery. EXAM: BILATERAL CAROTID DUPLEX ULTRASOUND TECHNIQUE: Pearline Cables scale imaging, color Doppler and duplex ultrasound were performed of bilateral carotid and vertebral arteries in the neck. COMPARISON:  No prior duplex FINDINGS: Criteria: Quantification of carotid stenosis is based on velocity parameters that correlate the residual internal carotid diameter with NASCET-based stenosis levels, using the diameter of the distal internal carotid lumen as the denominator for stenosis measurement. The following velocity measurements were obtained: RIGHT ICA:  Systolic 96 cm/sec, Diastolic 26 cm/sec CCA:  59 cm/sec SYSTOLIC ICA/CCA RATIO:  1.6 ECA:  96 cm/sec LEFT ICA:  Systolic 82 cm/sec, Diastolic 17 cm/sec CCA:  123456 cm/sec SYSTOLIC ICA/CCA RATIO:  0.8 ECA:  64 cm/sec Right Brachial SBP: Not acquired Left Brachial SBP: Not acquired RIGHT CAROTID ARTERY: No significant calcifications of the right common carotid artery. Intermediate waveform maintained. Heterogeneous and partially calcified plaque at the right carotid bifurcation. No significant  lumen shadowing. Low resistance waveform of the right ICA. No significant tortuosity. RIGHT VERTEBRAL ARTERY: Antegrade flow with low resistance waveform. LEFT CAROTID ARTERY: No significant calcifications of the left common carotid artery. Intermediate waveform maintained. Heterogeneous and partially calcified plaque at the left carotid bifurcation without significant lumen shadowing. Low resistance waveform of the left ICA. No significant tortuosity. LEFT VERTEBRAL ARTERY:  Antegrade flow with low resistance waveform. IMPRESSION: Color duplex indicates minimal heterogeneous and calcified plaque, with no hemodynamically significant stenosis by duplex criteria in the extracranial cerebrovascular circulation. Signed, Dulcy Fanny. Earleen Newport, DO [facility] Electronically Signed   By: Corrie Mckusick D.O.   On: 05/01/2015 16:47    EKG:   Orders placed or performed during the hospital encounter of 05/01/15  . ED EKG  . ED EKG      Management plans discussed with the patient, family and they are in agreement.  CODE STATUS: Full.    Code Status Orders        Start     Ordered   05/01/15 1120  Full code   Continuous     05/01/15 1119    Code Status History    Date Active Date Inactive Code Status Order ID Comments User Context   This patient has a current code status but no historical code status.  Advance Directive Documentation        Most Recent Value   Type of Advance Directive  Healthcare Power of Attorney, Living will   Pre-existing out of facility DNR order (yellow form or pink MOST form)     "MOST" Form in Place?        TOTAL TIME TAKING CARE OF THIS PATIENT: 35 minutes.    Vaughan Basta M.D on 05/02/2015 at 1:51 PM  Between 7am to 6pm - Pager - [phone]  After 6pm go to [email or web address] - password [email or web address] [facility]  Office  [phone]  CC: Primary care physician; Ezequiel Kayser,  MD   Note: This dictation was prepared with Dragon dictation along with smaller phrase technology. Any transcriptional errors that result from this process are unintentional.

## 2015-05-02 NOTE — Significant Event (Signed)
Rapid Response Event Note  Overview:  RR called for patient who "passed out" when trying to get up to [facility]    Initial Focused Assessment: Patient alert and oriented, responding appropriately, and appears in no distress.  VSS HR 67, RR 21, BP 130/78, spO2 99% on room air.     Interventions:  Vitals checked and focused neuro exam performed.  Likely Vagal response due to straining when trying to get up and pt constipated.   Event Summary:   at      at          Farris Has D

## 2015-05-02 NOTE — Progress Notes (Signed)
Dr Telemetry patient had 2 7 beat runs of PVC's SR now HR of 90, called Dr Anselm Jungling to let him no no new orders

## 2015-05-02 NOTE — Progress Notes (Signed)
Subjective: Patient back to baseline.  No further neurological complaints.  Did appear to have what was felt to be a vagal response early this AM due to an episode of unresponsiveness while straining.    Objective: Current vital signs: BP 131/79 mmHg  Pulse 79  Temp(Src) 97.7 F (36.5 C) (Oral)  Resp 18  Ht 5\' 2"  (1.575 m)  Wt 64.592 kg (142 lb 6.4 oz)  BMI 26.04 kg/m2  SpO2 97% Vital signs in last 24 hours: Temp:  [97.6 F (36.4 C)-97.9 F (36.6 C)] 97.7 F (36.5 C) (04/07 0946) Pulse Rate:  [69-79] 79 (04/07 0946) Resp:  [14-22] 18 (04/07 0946) BP: (108-158)/(56-111) 131/79 mmHg (04/07 0946) SpO2:  [95 %-100 %] 97 % (04/07 0946) Weight:  [64.592 kg (142 lb 6.4 oz)-67.699 kg (149 lb 4 oz)] 64.592 kg (142 lb 6.4 oz) (04/06 1200)  Intake/Output from previous day: 04/06 0701 - 04/07 0700 In: 240 [P.O.:240] Out: -  Intake/Output this shift:   Nutritional status: Diet Heart Room service appropriate?: Yes; Fluid consistency:: Thin  Neurologic Exam: Mental Status: Alert, oriented, thought content appropriate. Speech fluent without evidence of aphasia. Able to follow 3 step commands without difficulty. Cranial Nerves: II: Discs flat bilaterally; Visual fields grossly normal, pupils equal, round, reactive to light and accommodation III,IV, VI: ptosis not present, extra-ocular motions intact bilaterally V,VII: smile symmetric, facial light touch sensation normal bilaterally VIII: hearing normal bilaterally IX,X: gag reflex present XI: bilateral shoulder shrug XII: midline tongue extension Motor: Right :Upper extremity 5/5Left: Upper extremity 5/5 Lower extremity 5/5Lower extremity 5/5 Tone and bulk:normal tone throughout; no atrophy noted Gait: normal without need of an assistive device   Lab Results: Basic Metabolic Panel:  Recent Labs Lab 05/01/15 0939  05/02/15 0428  NA 134* 135  K 3.7 3.9  CL 103 107  CO2 27 25  GLUCOSE 114* 105*  BUN 16 21*  CREATININE 0.66 0.72  CALCIUM 9.7 9.0    Liver Function Tests:  Recent Labs Lab 05/01/15 0939  AST 26  ALT 17  ALKPHOS 78  BILITOT 0.7  PROT 6.9  ALBUMIN 4.2   No results for input(s): LIPASE, AMYLASE in the last 168 hours. No results for input(s): AMMONIA in the last 168 hours.  CBC:  Recent Labs Lab 05/01/15 0939 05/02/15 0428  WBC 5.8 8.8  NEUTROABS 4.0  --   HGB 13.3 12.2  HCT 38.5 35.4  MCV 87.3 88.9  PLT 217 215    Cardiac Enzymes:  Recent Labs Lab 05/01/15 0939  TROPONINI <0.03    Lipid Panel:  Recent Labs Lab 05/02/15 0428  CHOL 207*  TRIG 126  HDL 61  CHOLHDL 3.4  VLDL 25  LDLCALC 121*    CBG:  Recent Labs Lab 05/01/15 0928 05/02/15 0218  GLUCAP 110* 115*    Microbiology: No results found for this or any previous visit.  Coagulation Studies:  Recent Labs  05/01/15 0939  LABPROT 13.7  INR 1.03    Imaging: Ct Head Wo Contrast  05/01/2015  CLINICAL DATA:  RIGHT-sided weakness the began at 0700 hours, RIGHT leg became weak when up making coffee this morning, RIGHT leg has not felt RIGHT for 1 month but was weaker this morning, no arm weakness, code stroke EXAM: CT HEAD WITHOUT CONTRAST TECHNIQUE: Contiguous axial images were obtained from the base of the skull through the vertex without intravenous contrast. COMPARISON:  07/08/2012 FINDINGS: Mild age-related atrophy. Normal ventricular morphology. No midline shift or mass effect. Minimal small  vessel chronic ischemic changes of deep cerebral white matter. No intracranial hemorrhage, mass lesion or evidence acute infarction. No extra-axial fluid collections. Visualized paranasal sinuses and mastoid air cells clear. Bones demineralized. IMPRESSION: Minimal small vessel chronic ischemic changes of deep cerebral white matter. No acute intracranial abnormalities. Findings called to Dr. Jacqualine Code  on 05/01/2015 at 0944 hr. Electronically Signed   By: Lavonia Dana M.D.   On: 05/01/2015 09:48   Mr Virgel Paling Wo Contrast  05/01/2015  CLINICAL DATA:  82 year old female presenting with transient RIGHT lower extremity weakness and slurred speech. Normal neurologic exam. Initial encounter. EXAM: MRI HEAD WITHOUT CONTRAST MRA HEAD WITHOUT CONTRAST TECHNIQUE: Multiplanar, multiecho pulse sequences of the brain and surrounding structures were obtained without intravenous contrast. Angiographic images of the head were obtained using MRA technique without contrast. COMPARISON:  CT head performed earlier today. FINDINGS: MRI HEAD FINDINGS No evidence for acute infarction, hemorrhage, mass lesion, hydrocephalus, or extra-axial fluid. Mild atrophy, not unexpected for age. Mild to moderate subcortical and periventricular T2 and FLAIR hyperintensities, likely chronic microvascular ischemic change. Pituitary, pineal, and cerebellar tonsils unremarkable. No upper cervical lesions. Cervical spondylosis at C3-4, incompletely evaluated, with apparent central protrusion or disc osteophyte complex. Flow voids are maintained throughout the carotid, basilar, and vertebral arteries. There are no areas of chronic hemorrhage. Visualized calvarium, skull base, and upper cervical osseous structures unremarkable. Scalp and extracranial soft tissues, orbits, sinuses, and mastoids show no acute process. MRA HEAD FINDINGS The RIGHT internal carotid artery demonstrates moderate dolichoectasia. There is non stenotic atheromatous change at the junction of the cavernous and supraclinoid ICA. LEFT internal carotid artery is widely patent through its cervical, cavernous, and petrous segments. Long segment narrowing of the supraclinoid ICA, estimated 50%, not clearly flow reducing. Basilar artery widely patent with vertebrals codominant. Normal BILATERAL anterior cerebral arteries. Normal BILATERAL M1 MCA segments. Mild irregularity distal MCA  branches consistent with intracranial atherosclerotic disease. Normal proximal posterior cerebral arteries. Mild irregularity distal PCAs suggesting intracranial atherosclerotic change. No visualized cerebellar branch occlusion. No intracranial aneurysm. IMPRESSION: No evidence for acute stroke. Atrophy with mild to moderate small vessel disease. No flow reducing intracranial stenosis or aneurysm. C3-4 cervical spondylosis, possible stenosis, incompletely evaluated. If the patient's symptoms could be related to myelopathy, consider MRI cervical spine for further evaluation. Electronically Signed   By: Staci Righter M.D.   On: 05/01/2015 16:30   Mr Brain Wo Contrast  05/01/2015  CLINICAL DATA:  82 year old female presenting with transient RIGHT lower extremity weakness and slurred speech. Normal neurologic exam. Initial encounter. EXAM: MRI HEAD WITHOUT CONTRAST MRA HEAD WITHOUT CONTRAST TECHNIQUE: Multiplanar, multiecho pulse sequences of the brain and surrounding structures were obtained without intravenous contrast. Angiographic images of the head were obtained using MRA technique without contrast. COMPARISON:  CT head performed earlier today. FINDINGS: MRI HEAD FINDINGS No evidence for acute infarction, hemorrhage, mass lesion, hydrocephalus, or extra-axial fluid. Mild atrophy, not unexpected for age. Mild to moderate subcortical and periventricular T2 and FLAIR hyperintensities, likely chronic microvascular ischemic change. Pituitary, pineal, and cerebellar tonsils unremarkable. No upper cervical lesions. Cervical spondylosis at C3-4, incompletely evaluated, with apparent central protrusion or disc osteophyte complex. Flow voids are maintained throughout the carotid, basilar, and vertebral arteries. There are no areas of chronic hemorrhage. Visualized calvarium, skull base, and upper cervical osseous structures unremarkable. Scalp and extracranial soft tissues, orbits, sinuses, and mastoids show no acute  process. MRA HEAD FINDINGS The RIGHT internal carotid artery demonstrates moderate dolichoectasia. There is non stenotic  atheromatous change at the junction of the cavernous and supraclinoid ICA. LEFT internal carotid artery is widely patent through its cervical, cavernous, and petrous segments. Long segment narrowing of the supraclinoid ICA, estimated 50%, not clearly flow reducing. Basilar artery widely patent with vertebrals codominant. Normal BILATERAL anterior cerebral arteries. Normal BILATERAL M1 MCA segments. Mild irregularity distal MCA branches consistent with intracranial atherosclerotic disease. Normal proximal posterior cerebral arteries. Mild irregularity distal PCAs suggesting intracranial atherosclerotic change. No visualized cerebellar branch occlusion. No intracranial aneurysm. IMPRESSION: No evidence for acute stroke. Atrophy with mild to moderate small vessel disease. No flow reducing intracranial stenosis or aneurysm. C3-4 cervical spondylosis, possible stenosis, incompletely evaluated. If the patient's symptoms could be related to myelopathy, consider MRI cervical spine for further evaluation. Electronically Signed   By: Staci Righter M.D.   On: 05/01/2015 16:30   US Carotid Bilateral  05/01/2015  CLINICAL DATA:  82 year old female with a history of leg weakness. Cardiovascular risk factors include hypertension, known coronary artery disease, hyperlipidemia, known vascular disease with prior vascular surgery. EXAM: BILATERAL CAROTID DUPLEX ULTRASOUND TECHNIQUE: Pearline Cables scale imaging, color Doppler and duplex ultrasound were performed of bilateral carotid and vertebral arteries in the neck. COMPARISON:  No prior duplex FINDINGS: Criteria: Quantification of carotid stenosis is based on velocity parameters that correlate the residual internal carotid diameter with NASCET-based stenosis levels, using the diameter of the distal internal carotid lumen as the denominator for stenosis measurement. The  following velocity measurements were obtained: RIGHT ICA:  Systolic 96 cm/sec, Diastolic 26 cm/sec CCA:  59 cm/sec SYSTOLIC ICA/CCA RATIO:  1.6 ECA:  96 cm/sec LEFT ICA:  Systolic 82 cm/sec, Diastolic 17 cm/sec CCA:  123456 cm/sec SYSTOLIC ICA/CCA RATIO:  0.8 ECA:  64 cm/sec Right Brachial SBP: Not acquired Left Brachial SBP: Not acquired RIGHT CAROTID ARTERY: No significant calcifications of the right common carotid artery. Intermediate waveform maintained. Heterogeneous and partially calcified plaque at the right carotid bifurcation. No significant lumen shadowing. Low resistance waveform of the right ICA. No significant tortuosity. RIGHT VERTEBRAL ARTERY: Antegrade flow with low resistance waveform. LEFT CAROTID ARTERY: No significant calcifications of the left common carotid artery. Intermediate waveform maintained. Heterogeneous and partially calcified plaque at the left carotid bifurcation without significant lumen shadowing. Low resistance waveform of the left ICA. No significant tortuosity. LEFT VERTEBRAL ARTERY:  Antegrade flow with low resistance waveform. IMPRESSION: Color duplex indicates minimal heterogeneous and calcified plaque, with no hemodynamically significant stenosis by duplex criteria in the extracranial cerebrovascular circulation. Signed, Dulcy Fanny. Earleen Newport, DO [facility] Electronically Signed   By: Corrie Mckusick D.O.   On: 05/01/2015 16:47    Medications:  I have reviewed the patient's current medications. Scheduled: . amLODipine  5 mg Oral BID  . dipyridamole-aspirin  1 capsule Oral BID  . enoxaparin (LOVENOX) injection  40 mg Subcutaneous Q24H  . hydrochlorothiazide  12.5 mg Oral Daily  . lisinopril  40 mg Oral Daily  . potassium chloride  10 mEq Oral BID  . sodium chloride flush  3 mL Intravenous Q12H    Assessment/Plan: No new neurological complaints.  MRI of the brain personally reviewed and shows no acute changes.   MRA unremarkable.  Carotid dopplers show no hemodynamically significant stenosis.  Echocardiogram is pending.  LDL 121.  Patient allergic to statins so they will not be initiated.  Patient started on Aggrenox due to her eye doctor's concern for the use of Plavix.    Recommendations: 1.  Work up  to date is unremarkable.  If echocardiogram unremarkable would continue Aggrenox at discharge.  Patient to follow up with neurology as an outpatient.  Questions from patient and son addressed.        Alexis Goodell, MD Neurology [phone] 05/02/2015  9:51 AM

## 2015-05-02 NOTE — Progress Notes (Signed)
*  PRELIMINARY RESULTS* Echocardiogram 2D Echocardiogram has been performed.  Janet Barker 05/02/2015, 11:05 AM

## 2015-05-02 NOTE — Progress Notes (Signed)
Received MD order to discharge patient to home reviewed discharge instructions, home medications and prescriptions with patient and son and both verbalized understanding, discharge in wheelchair with nursing to home with son,

## 2015-05-02 NOTE — Evaluation (Signed)
Physical Therapy Evaluation Patient Details Name: EDRENA HANNERS MRN: [id number] DOB: 07-06-1924 Today's Date: 05/02/2015   History of Present Illness  Pt has been having some R LE issues for the last month (she describes it as sciatic-like), apparently has some short-term increased R sided issues but appears to be back near baseline. She does well with PT exam and ultimately appears safe to return home, she has not had any falls in the last 6 months.  MRI was negative for CVA, appears to be a possible TIA.  Clinical Impression  Pt does well with PT and shows good safety and confidence with all acts.  She does not appear to have any residual neuro issues that effect PT.  She was able to ambulate w/o AD and negotiate steps well, but is generally slow with minimally guarded ambulation.  She does not require PT at home as she appears safe, but will need to stay active during her hospital stay.    Follow Up Recommendations No PT follow up    Equipment Recommendations       Recommendations for Other Services       Precautions / Restrictions Precautions Precautions: Fall Restrictions Weight Bearing Restrictions: No      Mobility  Bed Mobility Overal bed mobility: Independent                Transfers Overall transfer level: Independent Equipment used: None             General transfer comment: Pt is able to get to standing w/o assist and shows good confidence and balance once up.  Ambulation/Gait Ambulation/Gait assistance: Supervision Ambulation Distance (Feet): 150 Feet Assistive device: None       General Gait Details: Pt with slow but safe and steady gait.  She displays good confidence and only needs directional cuing secondary to vision issues.  Pt with no significant fatigue after the effort.   Stairs Stairs: Yes Stairs assistance: Supervision Stair Management: One rail Right Number of Stairs: 5 General stair comments: Pt able to negotiate steps with step-to  strategy w/o safety issues.  Wheelchair Mobility    Modified Rankin (Stroke Patients Only)       Balance Overall balance assessment: Modified Independent                                           Pertinent Vitals/Pain Pain Assessment: No/denies pain    Home Living Family/patient expects to be discharged to:: Private residence Living Arrangements: Alone Available Help at Discharge: Family;Friend(s) Type of Home:  (condo) Home Access: Stairs to enter   [employer] of Steps: 2     Additional Comments: Pt has a lot of friends and family that are able to assist and help out.    Prior Function Level of Independence:  (occasionally uses cane)         Comments: Pt tries to get out as much as possible.     Hand Dominance        Extremity/Trunk Assessment   Upper Extremity Assessment: Overall WFL for tasks assessed           Lower Extremity Assessment: Overall WFL for tasks assessed (R LE strength = to L, no c/o pain t/o session)         Communication   Communication:  (poor vision)  Cognition Arousal/Alertness: Awake/alert Behavior During Therapy: WFL for tasks  assessed/performed Overall Cognitive Status: Within Functional Limits for tasks assessed                      General Comments      Exercises        Assessment/Plan    PT Assessment Patient needs continued PT services  PT Diagnosis Generalized weakness;Difficulty walking   PT Problem List Decreased strength;Decreased balance;Decreased activity tolerance;Decreased safety awareness  PT Treatment Interventions Gait training;Stair training;Functional mobility training;Therapeutic activities;Therapeutic exercise;Balance training;Neuromuscular re-education   PT Goals (Current goals can be found in the Care Plan section) Acute Rehab PT Goals Patient Stated Goal: go home PT Goal Formulation: With patient/family Time For Goal Achievement: 05/16/15 Potential to  Achieve Goals: Good    Frequency Min 2X/week   Barriers to discharge        Co-evaluation               End of Session Equipment Utilized During Treatment: Gait belt Activity Tolerance: Patient tolerated treatment well Patient left: with bed alarm set;with family/visitor present;with call bell/phone within reach      Functional Assessment Tool Used: clinical judgement Functional Limitation: Mobility: Walking and moving around Mobility: Walking and Moving Around Current Status [id number]): At least 20 percent but less than 40 percent impaired, limited or restricted Mobility: Walking and Moving Around Goal Status [phone]): At least 1 percent but less than 20 percent impaired, limited or restricted    Time: [id number] PT Time Calculation (min) (ACUTE ONLY): 18 min   Charges:   PT Evaluation $PT Eval Low Complexity: 1 Procedure     PT G Codes:   PT G-Codes **NOT FOR INPATIENT CLASS** Functional Assessment Tool Used: clinical judgement Functional Limitation: Mobility: Walking and moving around Mobility: Walking and Moving Around Current Status [id number]): At least 20 percent but less than 40 percent impaired, limited or restricted Mobility: Walking and Moving Around Goal Status [phone]): At least 1 percent but less than 20 percent impaired, limited or restricted   Wayne Both, PT, DPT [phone]  Kreg Shropshire 05/02/2015, 10:55 AM

## 2015-05-02 NOTE — Progress Notes (Signed)
Responded to Rapid Response call. Respiratory services were not needed.

## 2015-05-02 NOTE — Progress Notes (Signed)
Responded to rapid response. Patient awake and waiting to use bedside. Per RN patient had a cardiac event.  Rt services not needed at this time

## 2015-08-15 ENCOUNTER — Other Ambulatory Visit: Payer: Self-pay | Admitting: Orthopedic Surgery

## 2015-08-15 DIAGNOSIS — M5441 Lumbago with sciatica, right side: Secondary | ICD-10-CM

## 2015-08-15 DIAGNOSIS — M5431 Sciatica, right side: Secondary | ICD-10-CM

## 2015-09-02 ENCOUNTER — Ambulatory Visit
Admission: RE | Admit: 2015-09-02 | Discharge: 2015-09-02 | Disposition: A | Discharge status: Home / Self Care | Payer: Medicare Other | Source: Ambulatory Visit | Attending: Orthopedic Surgery | Admitting: Orthopedic Surgery

## 2015-09-02 DIAGNOSIS — N1330 Unspecified hydronephrosis: Secondary | ICD-10-CM | POA: Insufficient documentation

## 2015-09-02 DIAGNOSIS — M47896 Other spondylosis, lumbar region: Secondary | ICD-10-CM | POA: Insufficient documentation

## 2015-09-02 DIAGNOSIS — M5116 Intervertebral disc disorders with radiculopathy, lumbar region: Secondary | ICD-10-CM | POA: Diagnosis not present

## 2015-09-02 DIAGNOSIS — M5441 Lumbago with sciatica, right side: Secondary | ICD-10-CM | POA: Diagnosis present

## 2015-09-02 DIAGNOSIS — M5431 Sciatica, right side: Secondary | ICD-10-CM

## 2017-11-26 IMAGING — CT CT HEAD W/O CM
1 series · 16 of 29 positions shown, 20 images · non-contrast
Comparison: 07/08/2012

CLINICAL DATA: RIGHT-sided weakness the began at 2722 hours, RIGHT
leg became weak when up making coffee this morning, RIGHT leg has
not felt RIGHT for 1 month but was weaker this morning, no arm
weakness, code stroke

EXAM:
CT HEAD WITHOUT CONTRAST
TECHNIQUE: Contiguous axial images were obtained from the base of the skull
through the vertex without intravenous contrast.

[Series 2: soft tissue · axial · 0.42mm/px · z∈[-162,-32]mm · 16 of 29 slices shown, 20 images]
[im 2/29  brain]
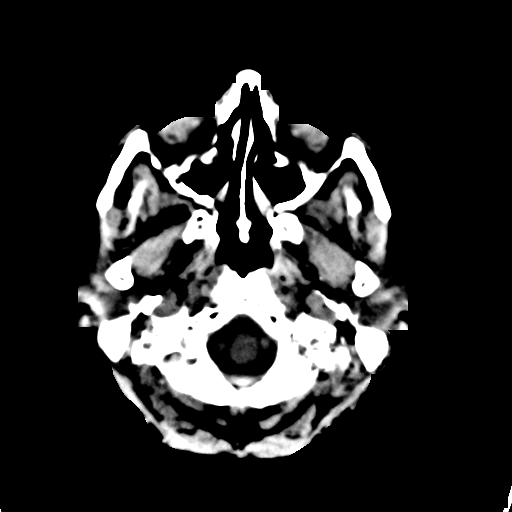
[im 2/29  bone]
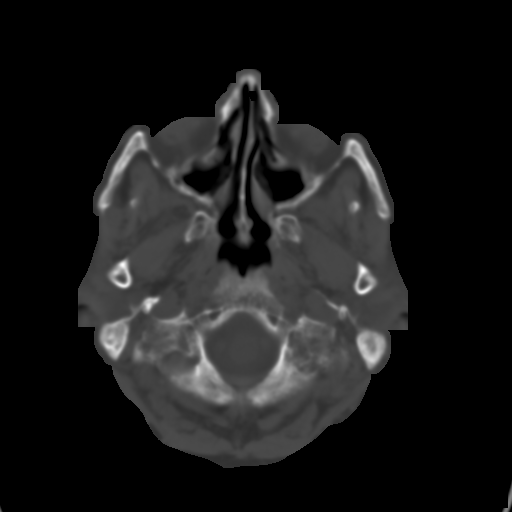
[im 4/29  brain]
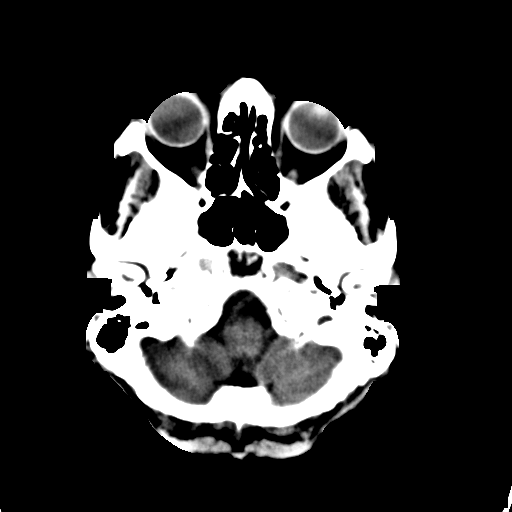
[im 6/29  brain]
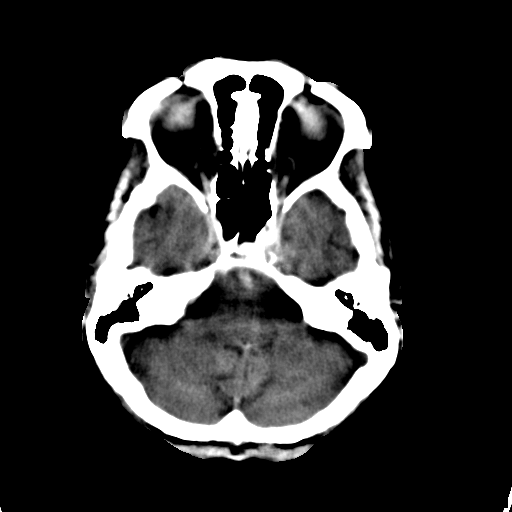
[im 7/29  brain]
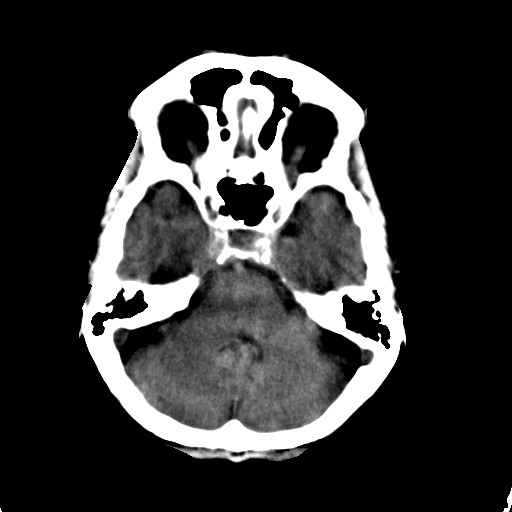
[im 9/29  brain]
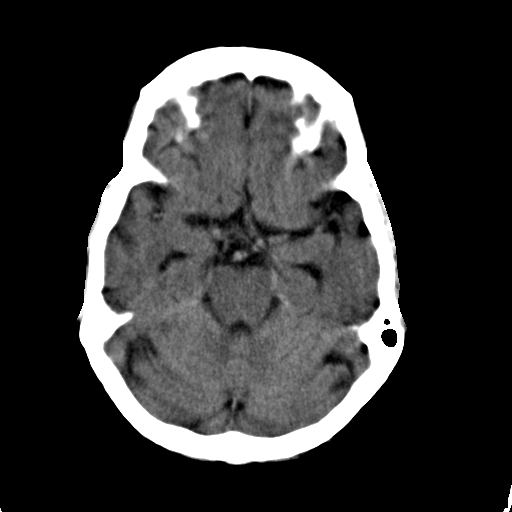
[im 9/29  bone]
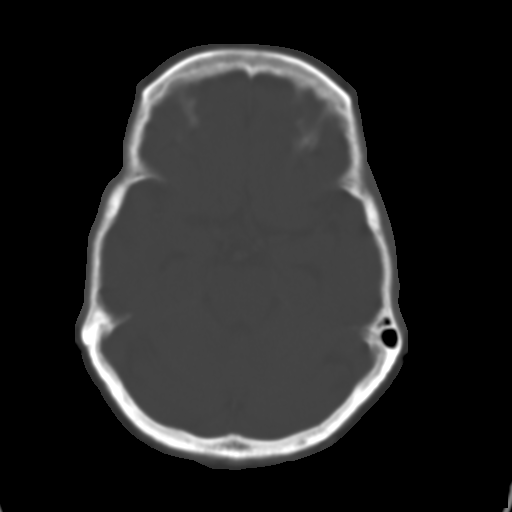
[im 11/29  brain]
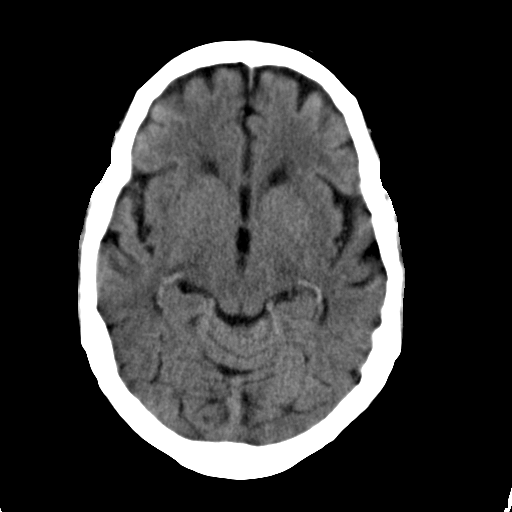
[im 12/29  brain]
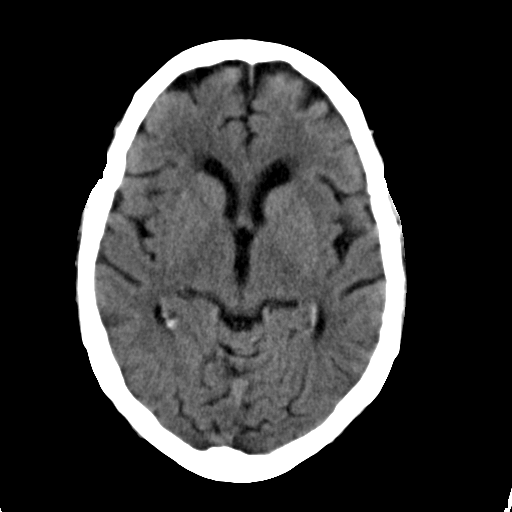
[im 14/29  brain]
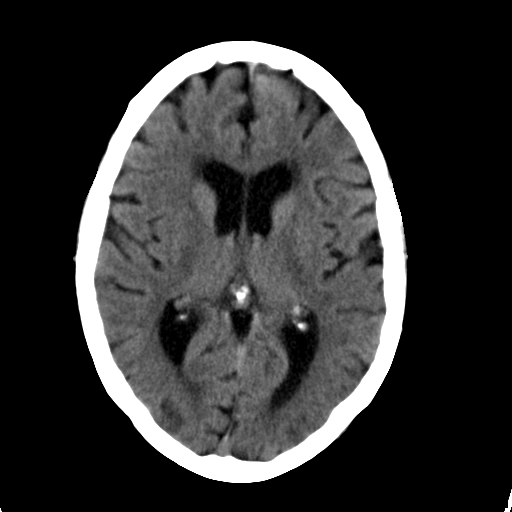
[im 16/29  brain]
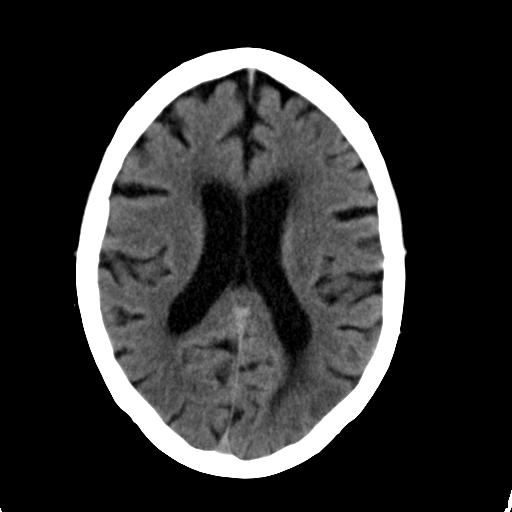
[im 16/29  bone]
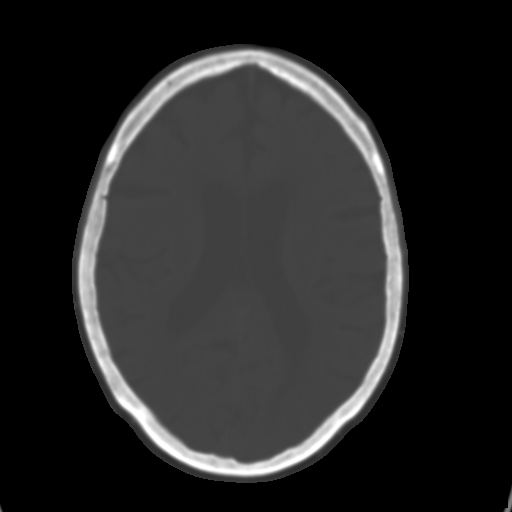
[im 18/29  brain]
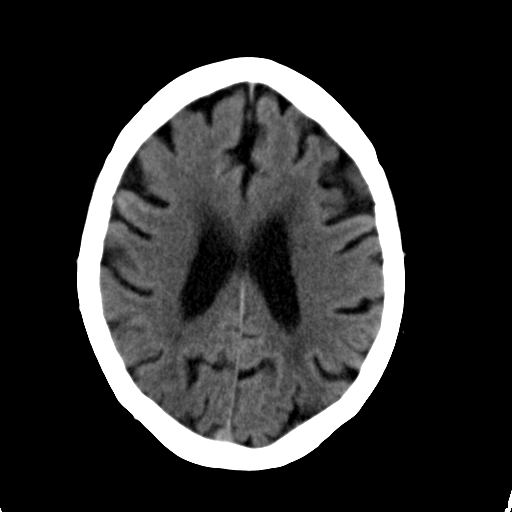
[im 19/29  brain]
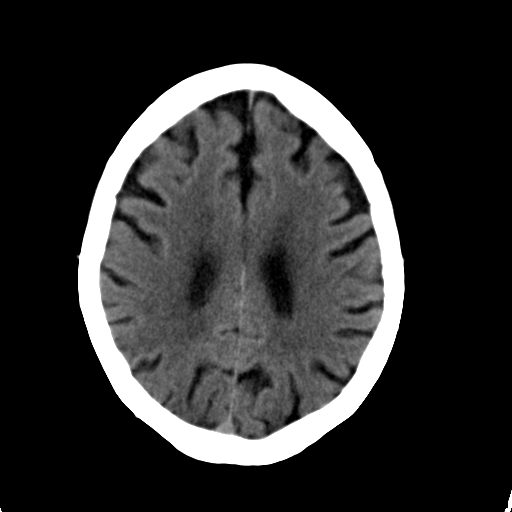
[im 21/29  brain]
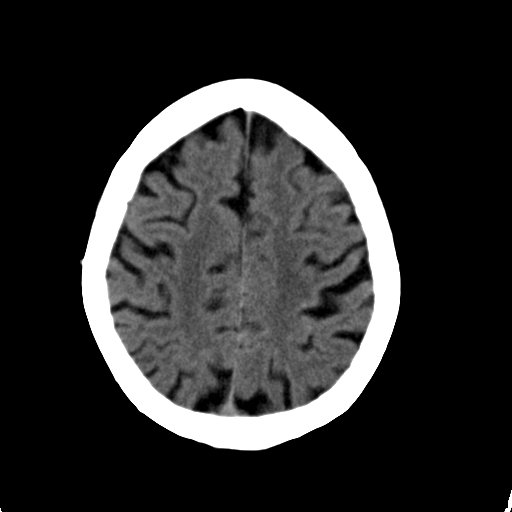
[im 23/29  brain]
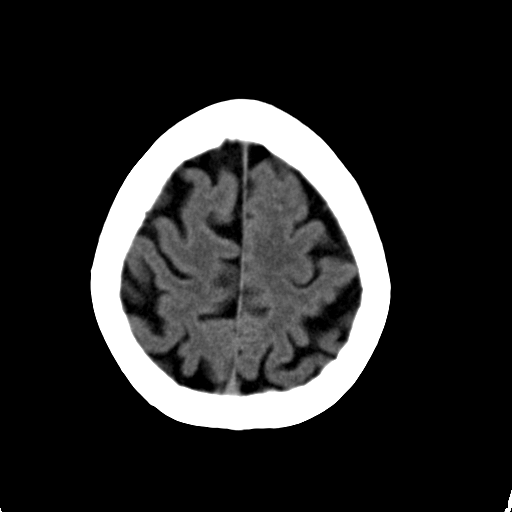
[im 23/29  bone]
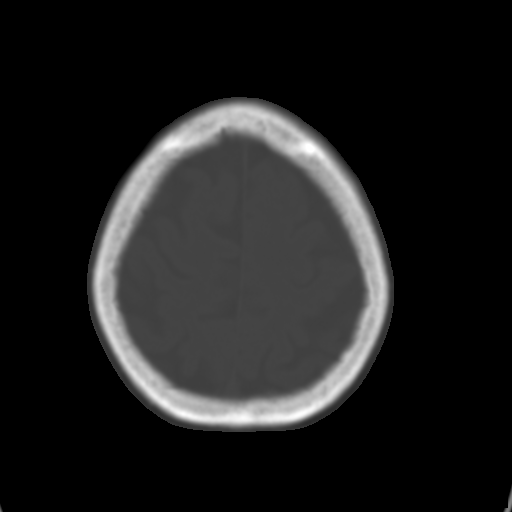
[im 24/29  brain]
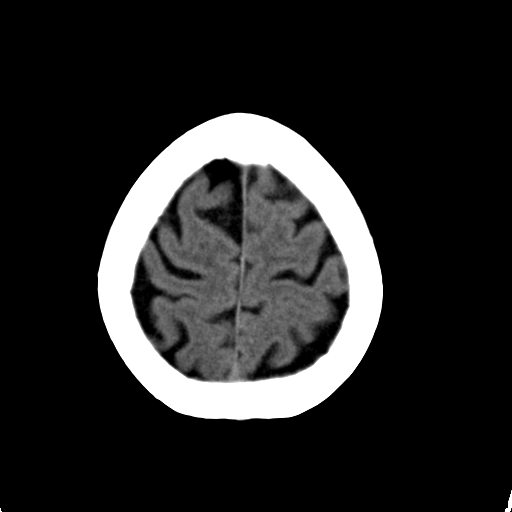
[im 26/29  brain]
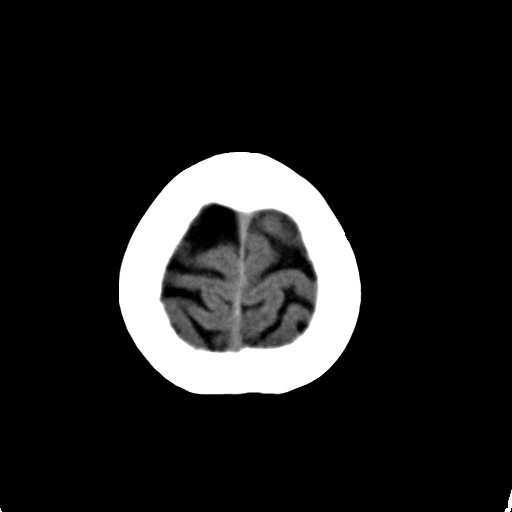
[im 28/29  brain]
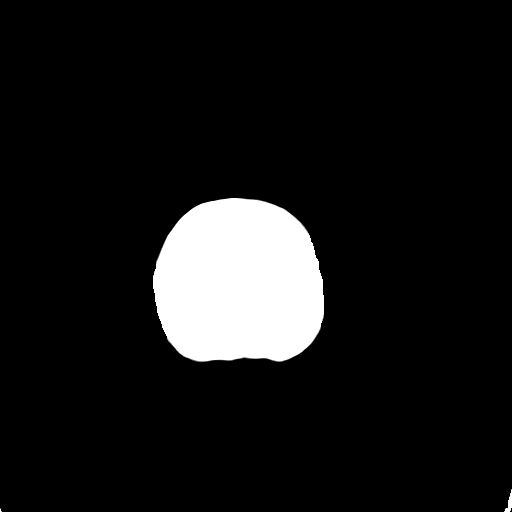

[16 of 29 positions shown; findings below may reference images not displayed]

FINDINGS: Mild age-related atrophy.

Normal ventricular morphology.

No midline shift or mass effect.

Minimal small vessel chronic ischemic changes of deep cerebral white
matter.

No intracranial hemorrhage, mass lesion or evidence acute
infarction.

No extra-axial fluid collections.

Visualized paranasal sinuses and mastoid air cells clear.

Bones demineralized.
IMPRESSION: Minimal small vessel chronic ischemic changes of deep cerebral white
matter.

No acute intracranial abnormalities.

Findings called to Dr. Armani on 05/01/2015 at 4022 hr.

## 2018-03-30 IMAGING — MR MR LUMBAR SPINE W/O CM
4 of 5 series · 24 of 48 positions shown · non-contrast
Comparison: None.

CLINICAL DATA: Low back pain radiating to the right leg and foot.
Foot numbness.

EXAM:
MRI LUMBAR SPINE WITHOUT CONTRAST
TECHNIQUE: Multiplanar, multisequence MR imaging of the lumbar spine was
performed. No intravenous contrast was administered.

[Series 3: T2 · sagittal · 4.0mm · 0.81mm/px · 7 of 15 slices shown (1 of 2)]
[im 1/15]
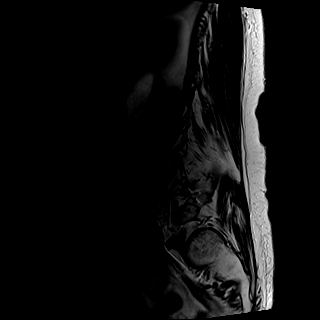
[im 3/15]
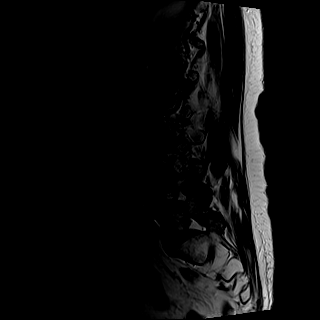
[im 5/15]
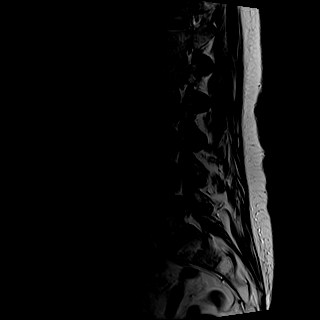
[im 8/15]
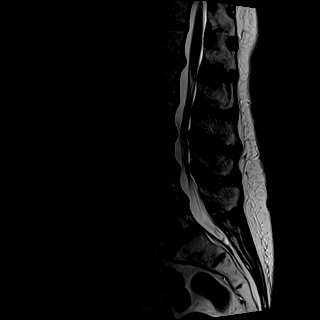
[im 10/15]
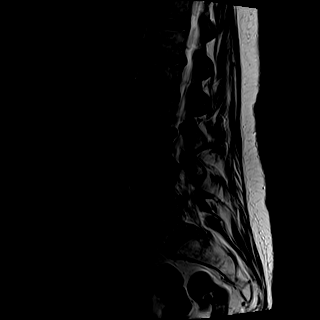
[im 12/15]
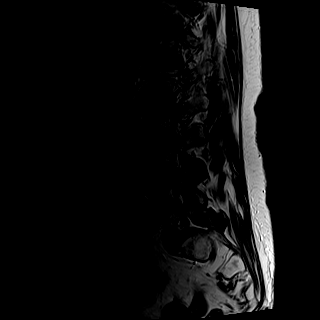
[im 15/15]
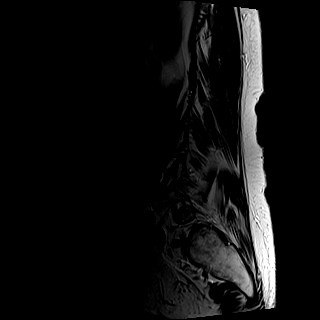

[Series 4: T1 · sagittal · 4.0mm · 0.41mm/px · 6 of 15 slices shown (1 of 2)]
[im 1/15]
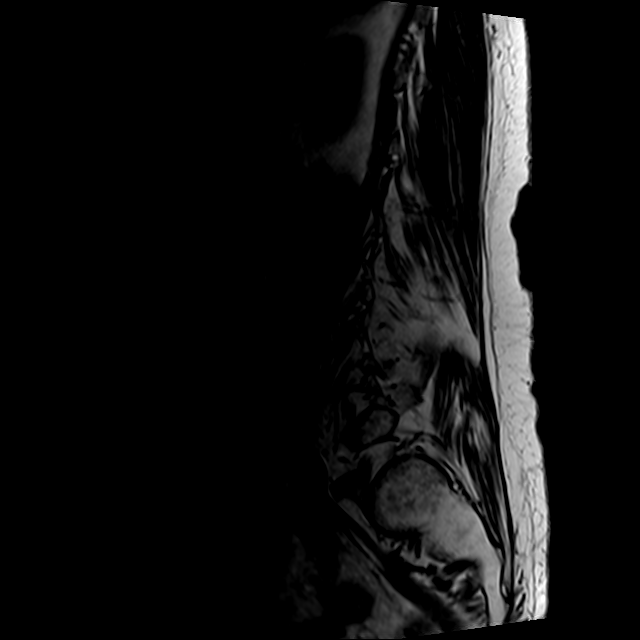
[im 3/15]
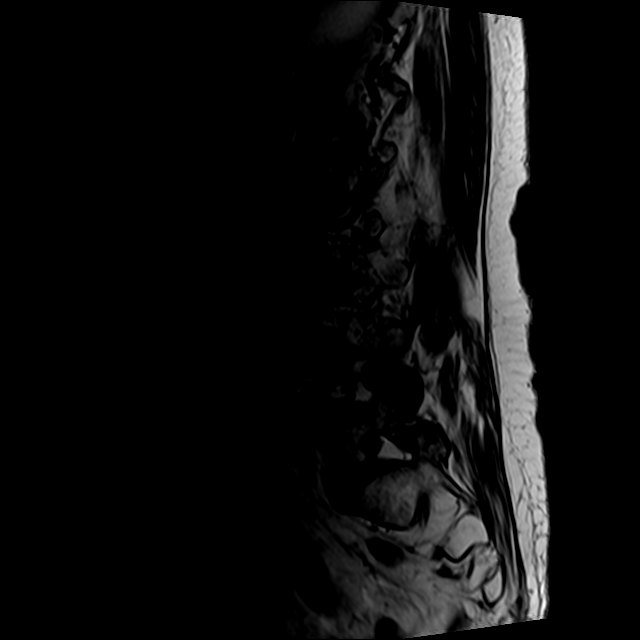
[im 5/15]
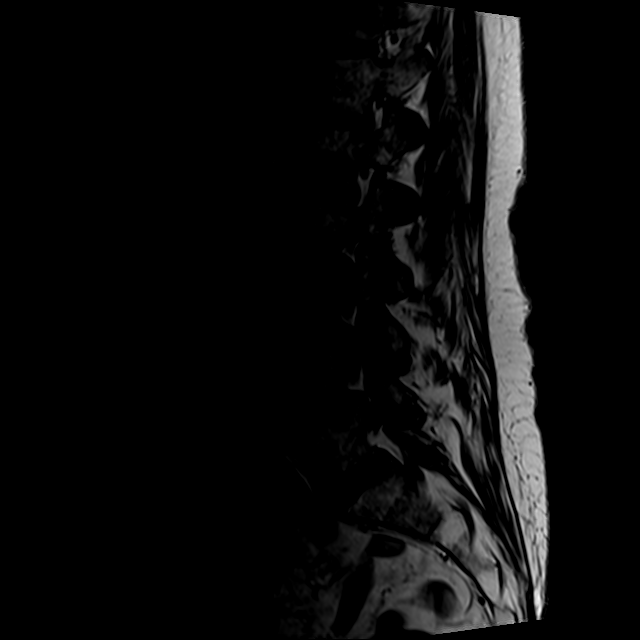
[im 8/15]
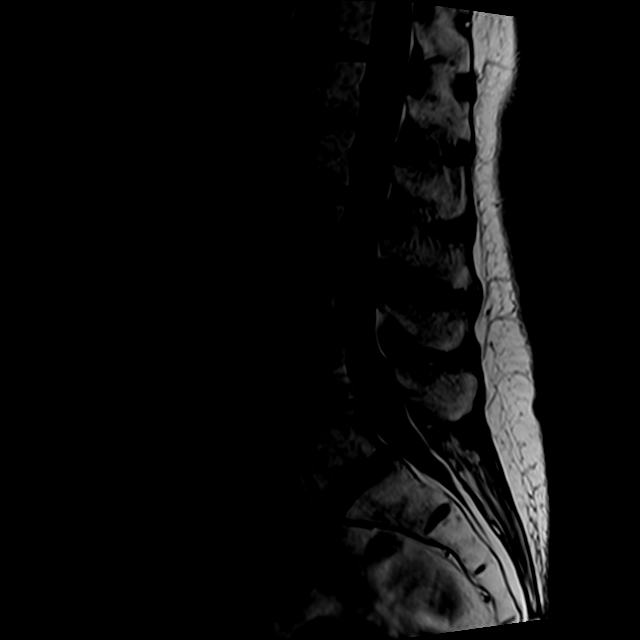
[im 10/15]
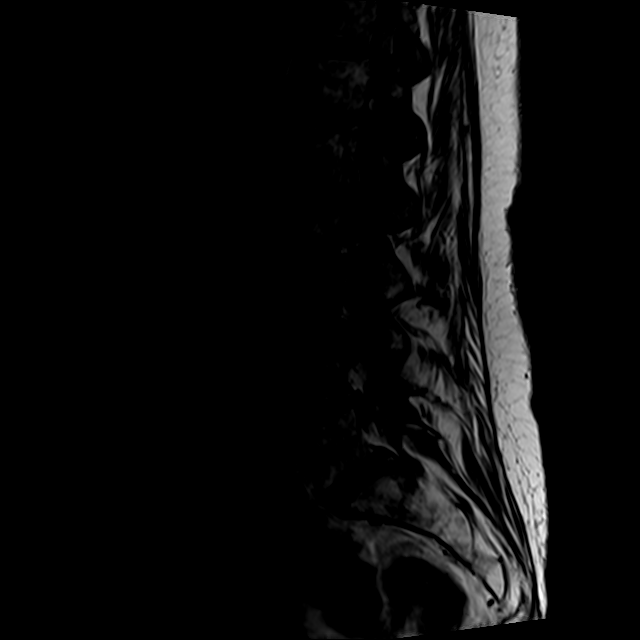
[im 12/15]
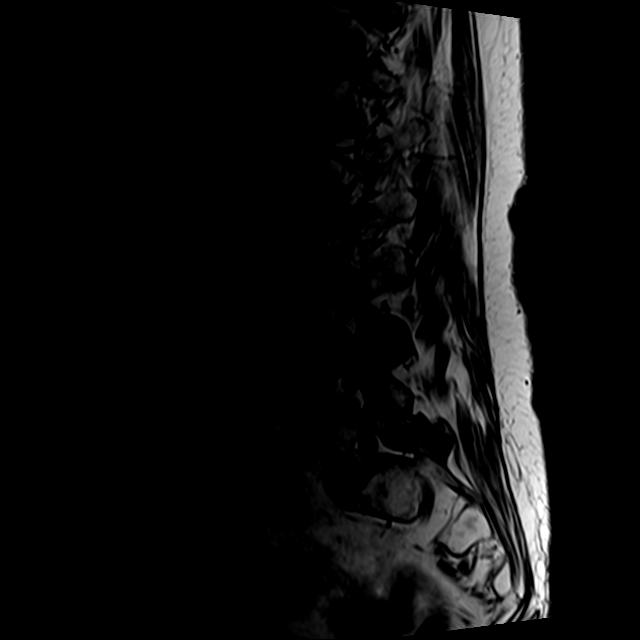

[Series 6: T2 · axial · 4.0mm · 0.78mm/px · z∈[-60,+135]mm · 8 of 34 slices shown (2 of 2)]
[im 1/34]
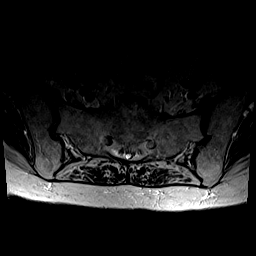
[im 6/34]
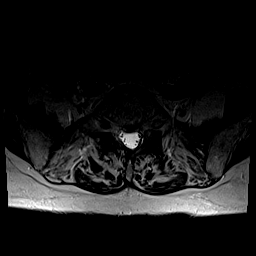
[im 11/34]
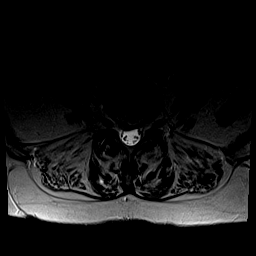
[im 16/34]
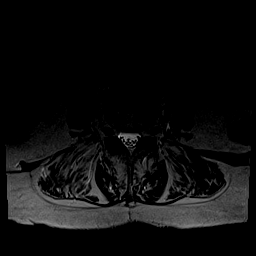
[im 18/34]
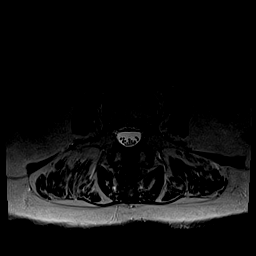
[im 23/34]
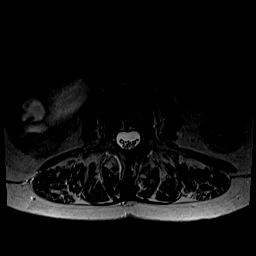
[im 28/34]
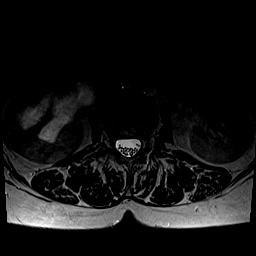
[im 34/34]
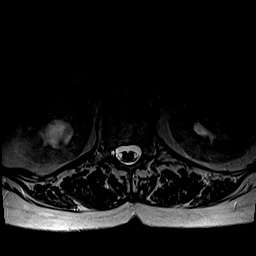

[Series 7: T1 · axial · 4.0mm · 0.31mm/px · z∈[-36,+105]mm · 3 of 34 slices shown (2 of 2)]
[im 6/34]
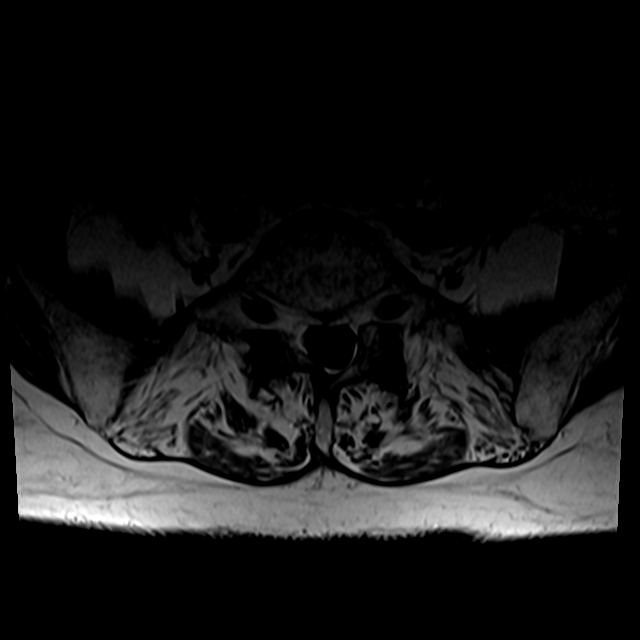
[im 18/34]
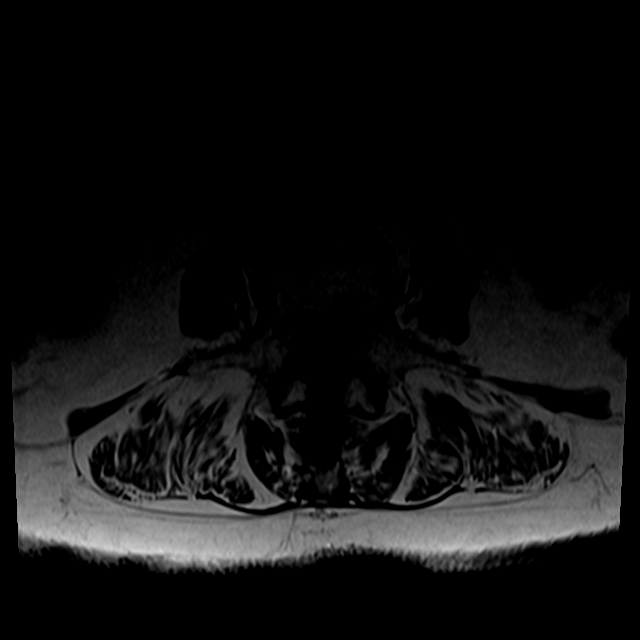
[im 28/34]
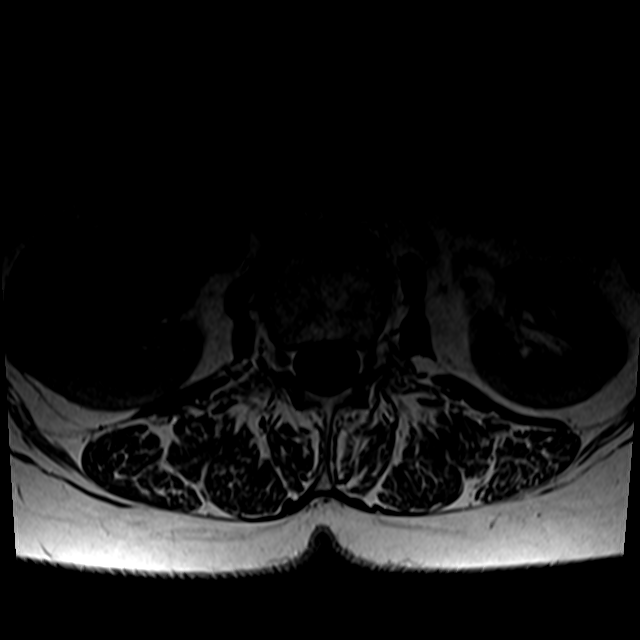

[24 of 48 positions shown; findings below may reference images not displayed]

FINDINGS: Segmentation: The lowest lumbar type non-rib-bearing vertebra is
labeled as L5.

Alignment: 4 mm degenerative anterolisthesis at L5-S1, without pars
defects.

Vertebrae: Small Schmorl' s nodes along the endplates at L2 and L3.
Disc desiccation at all levels between T12 and L5 with loss of disc
height most notable at L2-3, L3-4, and L4-5.

Conus medullaris: Extends to the L1 level and appears normal.

Paraspinal and other soft tissues: Moderate to prominent right renal
hydronephrosis, without visible hydroureter, query UPJ obstruction.
No significant scarring or parenchymal thinning of the right kidney
currently.

Fluid signal 6 mm right mid kidney lesion, probably a small cyst but
technically nonspecific.

Disc levels:

T12-L1:  No impingement.  Diffuse disc bulge.

L1-2:  No impingement.  Diffuse disc bulge.

L2-3: Mild to moderate central narrowing of the thecal sac with mild
left foraminal stenosis due to diffuse disc bulge and facet
arthropathy.

L3-4: Moderate central narrowing of the thecal sac with mild to
moderate bilateral subarticular lateral recess stenosis due to
diffuse disc bulge, facet arthropathy, and ligamentum flavum
redundancy.

L4-5: Moderate right and mild left subarticular lateral recess
stenosis due to right lateral recess disc protrusion, disc bulge,
and facet arthropathy.

L5-S1: Borderline left foraminal stenosis due to disc uncovering and
facet arthropathy.
IMPRESSION: 1. Lumbar spondylosis and degenerative disc disease, causing
moderate impingement at L3-4 and L4-5; and mild to moderate
impingement at L2-3, as detailed above.
2. Moderate to prominent right renal hydronephrosis, possibly from a
UPJ obstruction. No significant scarring or parenchymal thinning in
the right kidney currently ; if this has not been previously worked
up then urology referral would be recommended.

## 2021-06-25 ENCOUNTER — Ambulatory Visit
Admission: EM | Admit: 2021-06-25 | Discharge: 2021-06-25 | Disposition: A | Discharge status: Other Acute Inpatient Hospital | Payer: Medicare Other

## 2021-06-25 ENCOUNTER — Encounter: Payer: Self-pay | Admitting: Emergency Medicine

## 2021-06-25 ENCOUNTER — Emergency Department: Payer: Medicare Other

## 2021-06-25 ENCOUNTER — Other Ambulatory Visit: Payer: Self-pay

## 2021-06-25 ENCOUNTER — Inpatient Hospital Stay
Admission: EM | Admit: 2021-06-25 | Discharge: 2021-06-28 | DRG: 872 | Disposition: A | Discharge status: Home Health | Payer: Medicare Other | Source: Ambulatory Visit | Attending: Internal Medicine | Admitting: Internal Medicine

## 2021-06-25 DIAGNOSIS — H548 Legal blindness, as defined in USA: Secondary | ICD-10-CM | POA: Diagnosis present

## 2021-06-25 DIAGNOSIS — A4151 Sepsis due to Escherichia coli [E. coli]: Secondary | ICD-10-CM | POA: Diagnosis not present

## 2021-06-25 DIAGNOSIS — N390 Urinary tract infection, site not specified: Secondary | ICD-10-CM | POA: Diagnosis present

## 2021-06-25 DIAGNOSIS — E785 Hyperlipidemia, unspecified: Secondary | ICD-10-CM | POA: Diagnosis present

## 2021-06-25 DIAGNOSIS — R531 Weakness: Secondary | ICD-10-CM | POA: Diagnosis not present

## 2021-06-25 DIAGNOSIS — A419 Sepsis, unspecified organism: Secondary | ICD-10-CM | POA: Diagnosis present

## 2021-06-25 DIAGNOSIS — Z79899 Other long term (current) drug therapy: Secondary | ICD-10-CM

## 2021-06-25 DIAGNOSIS — Z882 Allergy status to sulfonamides status: Secondary | ICD-10-CM

## 2021-06-25 DIAGNOSIS — H543 Unqualified visual loss, both eyes: Secondary | ICD-10-CM | POA: Diagnosis present

## 2021-06-25 DIAGNOSIS — Z955 Presence of coronary angioplasty implant and graft: Secondary | ICD-10-CM

## 2021-06-25 DIAGNOSIS — Z90710 Acquired absence of both cervix and uterus: Secondary | ICD-10-CM

## 2021-06-25 DIAGNOSIS — Z66 Do not resuscitate: Secondary | ICD-10-CM | POA: Diagnosis present

## 2021-06-25 DIAGNOSIS — I10 Essential (primary) hypertension: Secondary | ICD-10-CM | POA: Diagnosis present

## 2021-06-25 DIAGNOSIS — D72829 Elevated white blood cell count, unspecified: Secondary | ICD-10-CM | POA: Diagnosis present

## 2021-06-25 DIAGNOSIS — R4182 Altered mental status, unspecified: Secondary | ICD-10-CM | POA: Diagnosis present

## 2021-06-25 DIAGNOSIS — H3530 Unspecified macular degeneration: Secondary | ICD-10-CM | POA: Diagnosis present

## 2021-06-25 DIAGNOSIS — Z8249 Family history of ischemic heart disease and other diseases of the circulatory system: Secondary | ICD-10-CM

## 2021-06-25 DIAGNOSIS — Z23 Encounter for immunization: Secondary | ICD-10-CM

## 2021-06-25 DIAGNOSIS — Z888 Allergy status to other drugs, medicaments and biological substances status: Secondary | ICD-10-CM

## 2021-06-25 DIAGNOSIS — I2510 Atherosclerotic heart disease of native coronary artery without angina pectoris: Secondary | ICD-10-CM | POA: Diagnosis present

## 2021-06-25 LAB — COMPREHENSIVE METABOLIC PANEL
ALT: 124 U/L — ABNORMAL HIGH (ref 0–44)
AST: 79 U/L — ABNORMAL HIGH (ref 15–41)
Albumin: 3.3 g/dL — ABNORMAL LOW (ref 3.5–5.0)
Alkaline Phosphatase: 191 U/L — ABNORMAL HIGH (ref 38–126)
Anion gap: 9 (ref 5–15)
BUN: 32 mg/dL — ABNORMAL HIGH (ref 8–23)
CO2: 24 mmol/L (ref 22–32)
Calcium: 9.4 mg/dL (ref 8.9–10.3)
Chloride: 98 mmol/L (ref 98–111)
Creatinine, Ser: 1 mg/dL (ref 0.44–1.00)
GFR, Estimated: 52 mL/min — ABNORMAL LOW (ref >60)
Glucose, Bld: 130 mg/dL — ABNORMAL HIGH (ref 70–99)
Potassium: 4.8 mmol/L (ref 3.5–5.1)
Sodium: 131 mmol/L — ABNORMAL LOW (ref 135–145)
Total Bilirubin: 1.1 mg/dL (ref 0.3–1.2)
Total Protein: 6.8 g/dL (ref 6.5–8.1)

## 2021-06-25 LAB — URINALYSIS, ROUTINE W REFLEX MICROSCOPIC
Bilirubin Urine: NEGATIVE
Glucose, UA: NEGATIVE mg/dL
Ketones, ur: 5 mg/dL — AB
Nitrite: NEGATIVE
Protein, ur: 30 mg/dL — AB
Specific Gravity, Urine: 1.008 (ref 1.005–1.030)
WBC, UA: >50 WBC/hpf — ABNORMAL HIGH (ref 0–5)
pH: 8 (ref 5.0–8.0)

## 2021-06-25 LAB — LACTIC ACID, PLASMA
Lactic Acid, Venous: 2 mmol/L (ref 0.5–1.9)
Lactic Acid, Venous: 1.9 mmol/L (ref 0.5–1.9)

## 2021-06-25 LAB — CBC
HCT: 37.2 % (ref 36.0–46.0)
Hemoglobin: 12.4 g/dL (ref 12.0–15.0)
MCH: 29.2 pg (ref 26.0–34.0)
MCHC: 33.3 g/dL (ref 30.0–36.0)
MCV: 87.7 fL (ref 80.0–100.0)
Platelets: 229 10*3/uL (ref 150–400)
RBC: 4.24 MIL/uL (ref 3.87–5.11)
RDW: 13.8 % (ref 11.5–15.5)
WBC: 21 10*3/uL — ABNORMAL HIGH (ref 4.0–10.5)
nRBC: 0 % (ref 0.0–0.2)

## 2021-06-25 LAB — PROCALCITONIN: Procalcitonin: 24.36 ng/mL

## 2021-06-25 MED ORDER — SODIUM CHLORIDE 0.9 % IV SOLN
1.0000 g | Freq: Once | INTRAVENOUS | Status: AC
  Administered 2021-06-25: 1 g via INTRAVENOUS
  Filled 2021-06-25: qty 10

## 2021-06-25 MED ORDER — LISINOPRIL 20 MG PO TABS
40.0000 mg | ORAL_TABLET | Freq: Every day | ORAL | Status: DC
  Administered 2021-06-26 – 2021-06-28 (×3): 40 mg via ORAL
  Filled 2021-06-25 (×3): qty 2

## 2021-06-25 MED ORDER — ENOXAPARIN SODIUM 40 MG/0.4ML IJ SOSY
40.0000 mg | PREFILLED_SYRINGE | INTRAMUSCULAR | Status: DC
  Administered 2021-06-25 – 2021-06-27 (×3): 40 mg via SUBCUTANEOUS
  Filled 2021-06-25 (×3): qty 0.4

## 2021-06-25 MED ORDER — ACETAMINOPHEN 650 MG RE SUPP: 650.0000 mg | Freq: Four times a day (QID) | RECTAL | Status: DC | PRN

## 2021-06-25 MED ORDER — SODIUM CHLORIDE 0.9 % IV BOLUS
500.0000 mL | Freq: Once | INTRAVENOUS | Status: AC
  Administered 2021-06-25: 500 mL via INTRAVENOUS

## 2021-06-25 MED ORDER — AMLODIPINE BESYLATE 5 MG PO TABS
5.0000 mg | ORAL_TABLET | Freq: Two times a day (BID) | ORAL | Status: DC
  Administered 2021-06-25 – 2021-06-28 (×6): 5 mg via ORAL
  Filled 2021-06-25 (×6): qty 1

## 2021-06-25 MED ORDER — ACETAMINOPHEN 325 MG PO TABS
650.0000 mg | ORAL_TABLET | Freq: Four times a day (QID) | ORAL | Status: DC | PRN
  Administered 2021-06-25 – 2021-06-26 (×2): 650 mg via ORAL
  Filled 2021-06-25 (×2): qty 2

## 2021-06-25 MED ORDER — LACTATED RINGERS IV BOLUS
1000.0000 mL | Freq: Once | INTRAVENOUS | Status: AC
  Administered 2021-06-25: 1000 mL via INTRAVENOUS

## 2021-06-25 MED ORDER — SODIUM CHLORIDE 0.9 % IV BOLUS (SEPSIS): 500.0000 mL | Freq: Once | INTRAVENOUS | Status: DC

## 2021-06-25 MED ORDER — SODIUM CHLORIDE 0.9 % IV SOLN: 2.0000 g | INTRAVENOUS | Status: DC

## 2021-06-25 MED ORDER — ONDANSETRON HCL 4 MG/2ML IJ SOLN: 4.0000 mg | Freq: Four times a day (QID) | INTRAMUSCULAR | Status: DC | PRN

## 2021-06-25 MED ORDER — ONDANSETRON HCL 4 MG PO TABS: 4.0000 mg | ORAL_TABLET | Freq: Four times a day (QID) | ORAL | Status: DC | PRN

## 2021-06-25 MED ORDER — SODIUM CHLORIDE 0.9 % IV SOLN: 1.0000 g | Freq: Once | INTRAVENOUS | Status: DC

## 2021-06-25 MED ORDER — PNEUMOCOCCAL 20-VAL CONJ VACC 0.5 ML IM SUSY
0.5000 mL | PREFILLED_SYRINGE | INTRAMUSCULAR | Status: AC
  Administered 2021-06-28: 0.5 mL via INTRAMUSCULAR
  Filled 2021-06-25 (×2): qty 0.5

## 2021-06-25 MED ORDER — ASPIRIN 81 MG PO TBEC
81.0000 mg | DELAYED_RELEASE_TABLET | Freq: Every day | ORAL | Status: DC
  Administered 2021-06-26 – 2021-06-28 (×3): 81 mg via ORAL
  Filled 2021-06-25 (×3): qty 1

## 2021-06-25 NOTE — ED Triage Notes (Signed)
Jeanett Schlein, pa at bedside discussing with patient and family member.

## 2021-06-25 NOTE — ED Triage Notes (Signed)
Patient to ED via [facility] from home. Patient c/o generalized weakness and unable to eat for the past 3 days. Patient alert and oriented. Blind at baseline.

## 2021-06-25 NOTE — Hospital Course (Addendum)
Janet Barker is a 88 year old female with history of hypertension, bilateral vision loss due to history of macular degeneration, who presents emergency department for chief concerns of altered mental status.  Patient initially presented to urgent care center however given patient's presentation of fever and altered mental status, patient was transferred from urgent care center to the emergency department for further evaluation.  Initial vitals in the emergency department showed temperature of 97.6, respiration rate of 20, heart rate of 90, blood pressure 140/78, SPO2 of 96% on room air.  Serum sodium is 131, potassium 4.8, chloride 98, bicarb 24, BUN of 32, serum creatinine of 1.00, GFR 52, nonfasting blood glucose 130, WBC 21, hemoglobin 12.4, platelets of 229.  Lactic acid was 2.0. UA showed large leukocytes.  Blood cultures x2 have been collected and are in process.  Urine culture has been collected and in process.  ED treatment: Ceftriaxone 1 g, LR 1 L bolus, sodium chloride 500 mL bolus.

## 2021-06-25 NOTE — Assessment & Plan Note (Signed)
-   Patient's home medication includes amlodipine 5 mg twice daily, clonidine 0.1 mg p.o. twice daily, hydrochlorothiazide 12.5 mg daily, lisinopril 40 mg daily**

## 2021-06-25 NOTE — Discharge Instructions (Signed)
Go straight to ER

## 2021-06-25 NOTE — ED Notes (Signed)
Patient is being discharged from the Urgent Care and sent to the Emergency Department via POV . Per [place], [place], patient is in need of higher level of care due to disorientation, fatigue. Patient is aware and verbalizes understanding of plan of care.  Vitals:   06/25/21 1122  BP: 140/78  Pulse: 90  Resp: 20  Temp: 98.6 F (37 C)  SpO2: 96%

## 2021-06-25 NOTE — H&P (Addendum)
History and Physical   Janet Barker NKN:[id number] DOB: 1924/04/07 DOA: 06/25/2021  PCP: Ezequiel Kayser, MD Patient coming from: Urgent care center  I have personally briefly reviewed patient's old medical records in [place].  Chief Concern: Fever, altered mental status  HPI: [name] is a 88 year old female with history of hypertension, bilateral vision loss due to history of macular degeneration, who presents emergency department for chief concerns of altered mental status.  Patient initially presented to urgent care center however given patient's presentation of fever and altered mental status, patient was transferred from urgent care center to the emergency department for further evaluation.  Initial vitals in the emergency department showed temperature of 97.6, respiration rate of 20, heart rate of 90, blood pressure 140/78, SPO2 of 96% on room air.  Serum sodium is 131, potassium 4.8, chloride 98, bicarb 24, BUN of 32, serum creatinine of 1.00, GFR 52, nonfasting blood glucose 130, WBC 21, hemoglobin 12.4, platelets of 229.  Lactic acid was 2.0. UA showed large leukocytes.  Blood cultures x2 have been collected and are in process.  Urine culture has been collected and in process.  ED treatment: Ceftriaxone 1 g, LR 1 L bolus, sodium chloride 500 mL bolus.  At bedside [name] was able to tell me her full name, her age, the current calendar year and she knows she is in the ER.  She reports that over the last 2 or 3 days she has been having subjective fever though she did not check her temperature.  She endorses decreased urine output and denies dysuria, hematuria, diarrhea, chest pain, shortness of breath, abdominal pain.  Social history: She lives in a condo by herself.  She denies history of tobacco, EtOH, recreational drug use.  She is retired and formerly worked in Tourist information centre manager.  ROS: Constitutional: no weight change, no fever ENT/Mouth: no sore throat, no  rhinorrhea Eyes: no eye pain, no vision changes Cardiovascular: no chest pain, no dyspnea,  no edema, no palpitations Respiratory: no cough, no sputum, no wheezing Gastrointestinal: no nausea, no vomiting, no diarrhea, no constipation Genitourinary: no urinary incontinence, no dysuria, no hematuria Musculoskeletal: no arthralgias, no myalgias Skin: no skin lesions, no pruritus, Neuro: + weakness, no loss of consciousness, no syncope Psych: no anxiety, no depression, + decrease appetite Heme/Lymph: no bruising, no bleeding  ED Course:  discussed with emergency medicine provider, patient requiring hospitalization for chief concerns of altered mental status secondary to UTI.  Assessment/Plan  Principal Problem:   Sepsis secondary to UTI [facility]) Active Problems:   Weakness   Altered mental status   Primary hypertension   Bilateral blindness   Leukocytosis   Assessment and Plan:  * Sepsis secondary to UTI ([place]) - Leukocytosis, lactic acid elevation, and initial reported altered mental status, source of urine - Blood cultures x2 have been collected and are in process - Urine cultures in process - Continue ceftriaxone 2 g IV daily - Patient is maintaining appropriate MAP and there is negative indications for septic shock at this time - Goal MAP greater than 65 - Admit to telemetry medical, observation  Leukocytosis - Secondary to UTI - Blood cultures in process and urine culture in process  Bilateral blindness - Secondary to macular degeneration that started when she was 67  Primary hypertension - Patient's home medication includes amlodipine 5 mg twice daily, clonidine 0.1 mg p.o. twice daily, hydrochlorothiazide 12.5 mg daily, lisinopril 40 mg daily**  Altered mental status - On evaluation currently appears  to be at baseline - Continue to monitor  Weakness - Check B12 - Fall precautions  Chart reviewed.   DVT prophylaxis: Enoxaparin Code Status: DNR/DNI Diet: Heart  healthy Family Communication: A phone call to Jaeline Whobrey (her son) has been offered, patient declined stating that her son already knows she is in the hospital Disposition Plan: Pending clinical course Consults called: None at this time Admission status: Telemetry medical, observation  Past Medical History:  Diagnosis Date   Cancer ([place])    skin ca   Coronary artery disease    Hypertension    Legally blind    Macular degeneration    Past Surgical History:  Procedure Laterality Date   ABDOMINAL HYSTERECTOMY     CORONARY ANGIOPLASTY WITH STENT PLACEMENT     EYE MUSCLE SURGERY     EYE SURGERY     Social History:  reports that she has never smoked. She does not have any smokeless tobacco history on file. She reports that she does not drink alcohol and does not use drugs.  Allergies  Allergen Reactions   Statins Other (See Comments)    Reaction: Muscle Pains   Sulfa Antibiotics Anxiety   Family History  Problem Relation Age of Onset   Breast cancer Sister 19   Hypertension Mother    CAD Father    Congestive Heart Failure Brother    Congestive Heart Failure Brother    CAD Brother    Family history: Family history reviewed and not pertinent  Prior to Admission medications   Medication Sig Start Date End Date Taking? Authorizing Provider  amLODipine (NORVASC) 5 MG tablet Take 1 tablet by mouth 2 (two) times daily. 04/13/15   [provider]  cloNIDine (CATAPRES) 0.1 MG tablet Take 0.1 mg by mouth 2 (two) times daily. 06/11/21   [provider]  dipyridamole-aspirin (AGGRENOX) 200-25 MG 12hr capsule Take 1 capsule by mouth 2 (two) times daily. 05/02/15   Vaughan Basta, MD  hydrochlorothiazide (MICROZIDE) 12.5 MG capsule Take 1 capsule by mouth daily. 02/18/15   [provider]  KLOR-CON 10 10 MEQ tablet Take 10 mEq by mouth 2 (two) times daily.  04/25/15   [provider]  lisinopril (PRINIVIL,ZESTRIL) 40 MG tablet Take 1 tablet by mouth  daily. 04/24/15   [provider]   Physical Exam: Vitals:   06/25/21 1427 06/25/21 1532 06/25/21 1637 06/25/21 1831  BP: 139/68 (!) 160/89 (!) 154/72 (!) 152/57  Pulse: 86 98 93 (!) 105  Resp: '18 18 18 18  '$ Temp:    (!) 100.8 F (38.2 C)  TempSrc:    Oral  SpO2: 96% 96% 95% 96%  Weight:      Height:       Constitutional: appears younger than chronological age, frail, NAD, calm, comfortable Eyes: PERRL, lids and conjunctivae normal.  Bilateral blindness ENMT: Mucous membranes are moist. Posterior pharynx clear of any exudate or lesions. Age-appropriate dentition. Hearing appropriate.  Neck: normal, supple, no masses, no thyromegaly Respiratory: clear to auscultation bilaterally, no wheezing, no crackles. Normal respiratory effort. No accessory muscle use.  Cardiovascular: Regular rate and rhythm, no murmurs / rubs / gallops. No extremity edema. 2+ pedal pulses. No carotid bruits.  Abdomen: no tenderness, no masses palpated, no hepatosplenomegaly. Bowel sounds positive.  Musculoskeletal: no clubbing / cyanosis. No joint deformity upper and lower extremities. Good ROM, no contractures, no atrophy. Normal muscle tone.  Skin: no rashes, lesions, ulcers. No induration Neurologic: Sensation intact. Strength 5/5 in all 4.  Psychiatric: Normal judgment and insight. Alert and oriented x 3. Normal mood.   EKG: independently reviewed, showing sinus rhythm with rate of 83, QTc 387  Chest x-ray on Admission: I personally reviewed and I agree with radiologist reading as below.  DG Chest Port 1 View  Result Date: 06/25/2021 CLINICAL DATA:  Weakness. EXAM: PORTABLE CHEST 1 VIEW COMPARISON:  None Available. FINDINGS: The heart size and mediastinal contours are within normal limits. Eventration of the right hemidiaphragm. Lungs are clear without evidence of focal consolidation or pleural effusion. Mild bilateral glenohumeral osteoarthritis. Thoracic spondylosis. No acute osseous abnormality.  IMPRESSION: No active disease. Electronically Signed   By: Keane Police D.O.   On: 06/25/2021 14:17    Labs on Admission: I have personally reviewed following labs  CBC: Recent Labs  Lab 06/25/21 1332  WBC 21.0*  HGB 12.4  HCT 37.2  MCV 87.7  PLT 111   Basic Metabolic Panel: Recent Labs  Lab 06/25/21 1332  NA 131*  K 4.8  CL 98  CO2 24  GLUCOSE 130*  BUN 32*  CREATININE 1.00  CALCIUM 9.4   GFR: Estimated Creatinine Clearance: 30.1 mL/min (by C-G formula based on SCr of 1 mg/dL).  Liver Function Tests: Recent Labs  Lab 06/25/21 1332  AST 79*  ALT 124*  ALKPHOS 191*  BILITOT 1.1  PROT 6.8  ALBUMIN 3.3*   Urine analysis:    Component Value Date/Time   COLORURINE YELLOW (A) 06/25/2021 1333   APPEARANCEUR HAZY (A) 06/25/2021 1333   APPEARANCEUR CLEAR 07/08/2012 1458   LABSPEC 1.008 06/25/2021 1333   LABSPEC 1.005 07/08/2012 1458   PHURINE 8.0 06/25/2021 1333   GLUCOSEU NEGATIVE 06/25/2021 1333   GLUCOSEU NEGATIVE 07/08/2012 1458   HGBUR SMALL (A) 06/25/2021 1333   BILIRUBINUR NEGATIVE 06/25/2021 1333   BILIRUBINUR NEGATIVE 07/08/2012 1458   KETONESUR 5 (A) 06/25/2021 1333   PROTEINUR 30 (A) 06/25/2021 1333   NITRITE NEGATIVE 06/25/2021 1333   LEUKOCYTESUR LARGE (A) 06/25/2021 1333   LEUKOCYTESUR NEGATIVE 07/08/2012 1458   Dr. Tobie Poet [facility]  If 7PM-7AM, please contact overnight-coverage provider If 7AM-7PM, please contact day coverage provider [email or web address]  06/25/2021, 7:00 PM

## 2021-06-25 NOTE — ED Notes (Signed)
Critical Result: Lactic Acid 2.0  Archie Balboa, MD aware

## 2021-06-25 NOTE — Assessment & Plan Note (Addendum)
-   Leukocytosis, lactic acid elevation, and initial reported altered mental status, source of urine - Blood cultures x2 have been collected and are in process - Urine cultures in process - Continue ceftriaxone 2 g IV daily - Patient is maintaining appropriate MAP and there is negative indications for septic shock at this time - Goal MAP greater than 65 - Admit to telemetry medical, observation

## 2021-06-25 NOTE — Plan of Care (Signed)

## 2021-06-25 NOTE — Assessment & Plan Note (Signed)
-   On evaluation currently appears to be at baseline - Continue to monitor

## 2021-06-25 NOTE — ED Provider Notes (Signed)
[facility]    CSN: [id number] Arrival date & time: 06/25/21  1041      History   Chief Complaint Chief Complaint  Patient presents with   Weakness    HPI Janet Barker is a 88 y.o. female.   88 year old female patient, Janet Barker, presents to urgent care with chief complaint of weakness loss of appetite fever confusion/disorientation for the last 2 to 3 days, states it is getting worse, felt like she was burning up sweaty and clammy today, and then it went away.  Patient contacted her life alert system and was evaluated and recommended to go to the emergency room however at that time patient refused.  Patient denies any dysuria, chest pain, cough or palpitations.   Given patient past medical history of coronary artery disease, hypertension, PVCs, peripheral vascular disease, with a new onset of weakness and disorientation recommend further evaluation immediately in the emergency room.  Patient has family member/driver in urgent care with her and she will take her to the closest emergency room.  Limited exam.   Weakness Severity:  Unable to specify Onset quality:  Unable to specify Duration:  3 days Timing:  Intermittent Progression:  Worsening Context comment:  Clonidine recently added to medication list per family member Relieved by:  Nothing Associated symptoms: fever and lethargy   Associated symptoms: no chest pain, no cough, no dysuria, no nausea, no shortness of breath and no vomiting   Associated symptoms comment:  Loss of appetite, fever, confusion" disorientation".  Patient called Lifealert this am and was checked out and recommended to go to Er for evaluation,pt refused. Pt is blind and lives alone.   Past Medical History:  Diagnosis Date   Cancer [facility])    skin ca   Coronary artery disease    Hypertension    Legally blind    Macular degeneration     Patient Active Problem List   Diagnosis Date Noted   Weakness 06/25/2021   TIA (transient  ischemic attack) 05/01/2015    Past Surgical History:  Procedure Laterality Date   ABDOMINAL HYSTERECTOMY     CORONARY ANGIOPLASTY WITH STENT PLACEMENT     EYE MUSCLE SURGERY     EYE SURGERY      OB History   No obstetric history on file.      Home Medications    Prior to Admission medications   Medication Sig Start Date End Date Taking? Authorizing Provider  amLODipine (NORVASC) 5 MG tablet Take 1 tablet by mouth 2 (two) times daily. 04/13/15   [provider]  cloNIDine (CATAPRES) 0.1 MG tablet Take 0.1 mg by mouth 2 (two) times daily. 06/11/21   [provider]  dipyridamole-aspirin (AGGRENOX) 200-25 MG 12hr capsule Take 1 capsule by mouth 2 (two) times daily. 05/02/15   Vaughan Basta, MD  hydrochlorothiazide (MICROZIDE) 12.5 MG capsule Take 1 capsule by mouth daily. 02/18/15   [provider]  KLOR-CON 10 10 MEQ tablet Take 10 mEq by mouth 2 (two) times daily.  04/25/15   [provider]  lisinopril (PRINIVIL,ZESTRIL) 40 MG tablet Take 1 tablet by mouth daily. 04/24/15   [provider]    Family History Family History  Problem Relation Age of Onset   Breast cancer Sister 22   Hypertension Mother    CAD Father    Congestive Heart Failure Brother    Congestive Heart Failure Brother    CAD Brother     Social History Social History  Tobacco Use   Smoking status: Never  Vaping Use   Vaping Use: Never used  Substance Use Topics   Alcohol use: No   Drug use: No     Allergies   Statins and Sulfa antibiotics   Review of Systems Review of Systems  Constitutional:  Positive for appetite change and fever.  HENT:  Negative for congestion.   Respiratory:  Negative for cough, chest tightness and shortness of breath.   Cardiovascular:  Negative for chest pain and palpitations.  Gastrointestinal:  Negative for nausea and vomiting.  Genitourinary:  Negative for dysuria.  Neurological:  Positive for weakness.        Confusion /disorientation  Psychiatric/Behavioral:  Positive for confusion.   All other systems reviewed and are negative.   Physical Exam Triage Vital Signs ED Triage Vitals [06/25/21 1122]  Enc Vitals Group     BP 140/78     Pulse Rate 90     Resp 20     Temp 98.6 F (37 C)     Temp Source Oral     SpO2 96 %     Weight      Height      Head Circumference      Peak Flow      Pain Score      Pain Loc      Pain Edu?      Excl. in [place]?    No data found.  Updated Vital Signs BP 140/78 (BP Location: Right Arm)   Pulse 90   Temp 98.6 F (37 C) (Oral)   Resp 20   SpO2 96%   Visual Acuity Right Eye Distance:   Left Eye Distance:   Bilateral Distance:    Right Eye Near:   Left Eye Near:    Bilateral Near:     Physical Exam Vitals and nursing note reviewed.  Cardiovascular:     Rate and Rhythm: Normal rate.  Pulmonary:     Breath sounds: Examination of the right-lower field reveals decreased breath sounds. Examination of the left-lower field reveals decreased breath sounds. Decreased breath sounds present.  Neurological:     Mental Status: She is alert.  Psychiatric:        Attention and Perception: Attention normal.        Speech: Speech normal.        Behavior: Behavior is cooperative.     UC Treatments / Results  Labs (all labs ordered are listed, but only abnormal results are displayed) Labs Reviewed - No data to display  EKG   Radiology No results found.  Procedures Procedures (including critical care time)  Medications Ordered in UC Medications - No data to display  Initial Impression / Assessment and Plan / UC Course  I have reviewed the triage vital signs and the nursing notes.  Pertinent labs & imaging results that were available during my care of the patient were reviewed by me and considered in my medical decision making (see chart for details).     DDX: TIA,CVA,ACS, UTI, electrolyte imbalance, pneumonia, Viral illness Final  Clinical Impressions(s) / UC Diagnoses   Final diagnoses:  Weakness     Discharge Instructions      Go straight to ER    ED Prescriptions   None    PDMP not reviewed this encounter.   Tori Milks, NP 92/42/68 1218

## 2021-06-25 NOTE — Assessment & Plan Note (Signed)
-   Secondary to UTI - Blood cultures in process and urine culture in process

## 2021-06-25 NOTE — Assessment & Plan Note (Signed)
-   Check B12 - Fall precautions

## 2021-06-25 NOTE — ED Provider Notes (Signed)
[facility] Provider Note    Event Date/Time   First MD Initiated Contact with Patient 06/25/21 1506     (approximate)   History   Weakness   HPI  Janet Barker is a 88 y.o. female  who, per clinic note dated 05/13/2021 has history of CAD, HLD, who presents to the emergency department today from urgent care where she presented because of concern for weakness and chills. Symptoms started 3 days ago.  At one time the patient felt she was starting get better but that she did not.  She has had generalized fatigue.  She has not had a chance to check her temperature to see how high.  She has noticed some associated sore throat.  Has had decreased appetite.  Patient denies any vomiting or diarrhea.  Denies any urinary changes.   Physical Exam   Triage Vital Signs: ED Triage Vitals  Enc Vitals Group     BP 06/25/21 1310 (!) 148/88     Pulse Rate 06/25/21 1310 77     Resp 06/25/21 1310 18     Temp 06/25/21 1310 97.6 F (36.4 C)     Temp Source 06/25/21 1310 Oral     SpO2 06/25/21 1308 98 %     Weight 06/25/21 1311 140 lb (63.5 kg)     Height 06/25/21 1311 '5\' 2"'$  (1.575 m)     Head Circumference --      Peak Flow --      Pain Score 06/25/21 1310 0   Most recent vital signs: Vitals:   06/25/21 1310 06/25/21 1427  BP: (!) 148/88 139/68  Pulse: 77 86  Resp: 18 18  Temp: 97.6 F (36.4 C)   SpO2: 97% 96%   General: Awake, alert and oriented. CV:  Good peripheral perfusion. Regular rate and rhythm. Resp:  Normal effort. Lungs clear. Abd:  No distention. Non tender. Other:  No lower extremity edema.    ED Results / Procedures / Treatments   Labs (all labs ordered are listed, but only abnormal results are displayed) Labs Reviewed  CBC - Abnormal; Notable for the following components:      Result Value   WBC 21.0 (*)    All other components within normal limits  COMPREHENSIVE METABOLIC PANEL - Abnormal; Notable for the following components:   Sodium  131 (*)    Glucose, Bld 130 (*)    BUN 32 (*)    Albumin 3.3 (*)    AST 79 (*)    ALT 124 (*)    Alkaline Phosphatase 191 (*)    GFR, Estimated 52 (*)    All other components within normal limits  URINALYSIS, ROUTINE W REFLEX MICROSCOPIC - Abnormal; Notable for the following components:   Color, Urine YELLOW (*)    APPearance HAZY (*)    Hgb urine dipstick SMALL (*)    Ketones, ur 5 (*)    Protein, ur 30 (*)    Leukocytes,Ua LARGE (*)    WBC, UA >50 (*)    Bacteria, UA RARE (*)    All other components within normal limits  LACTIC ACID, PLASMA - Abnormal; Notable for the following components:   Lactic Acid, Venous 2.0 (*)    All other components within normal limits  CULTURE, BLOOD (ROUTINE X 2)  CULTURE, BLOOD (ROUTINE X 2)  URINE CULTURE  LACTIC ACID, PLASMA     EKG  I, Nance Pear, attending physician, personally viewed and interpreted this EKG  EKG Time: 1347 Rate: 83 Rhythm: sinus rhythm with PVC/PAC Axis: normal Intervals: qtc 383 QRS: narrow ST changes: no st elevation Impression: abnormal ekg  RADIOLOGY I independently interpreted and visualized the CXR. My interpretation: No pneumonia. No pneumothorax. Radiology interpretation:  IMPRESSION:  No active disease.       PROCEDURES:  Critical Care performed: No  Procedures   MEDICATIONS ORDERED IN ED: Medications  sodium chloride 0.9 % bolus 500 mL (500 mLs Intravenous New Bag/Given 06/25/21 1350)     IMPRESSION / MDM / ASSESSMENT AND PLAN / ED COURSE  I reviewed the triage vital signs and the nursing notes.                              Differential diagnosis includes, but is not limited to, anemia, electrolyte abnormality, infection.  Patient's presentation is most consistent with acute presentation with potential threat to life or bodily function.  Patient presented to the emergency department today because of concerns for weakness, and chills.  Patient is work-up here is concerning for  urinary tract infection.  Additionally patient had significant leukocytosis and slight lactic acidosis on blood work.  Given this constellation of findings I do think patient would benefit from IV antibiotics.  Discussed findings with patient.  Discussed with Dr. Tobie Poet with the hospitalist service who will plan on admission.   FINAL CLINICAL IMPRESSION(S) / ED DIAGNOSES   Final diagnoses:  Lower urinary tract infectious disease       Note:  This document was prepared using Dragon voice recognition software and may include unintentional dictation errors.    Nance Pear, MD 06/25/21 1705

## 2021-06-25 NOTE — Assessment & Plan Note (Signed)
-   Secondary to macular degeneration that started when she was 89

## 2021-06-26 DIAGNOSIS — Z23 Encounter for immunization: Secondary | ICD-10-CM | POA: Diagnosis present

## 2021-06-26 DIAGNOSIS — A419 Sepsis, unspecified organism: Secondary | ICD-10-CM | POA: Diagnosis not present

## 2021-06-26 DIAGNOSIS — Z955 Presence of coronary angioplasty implant and graft: Secondary | ICD-10-CM | POA: Diagnosis not present

## 2021-06-26 DIAGNOSIS — I2510 Atherosclerotic heart disease of native coronary artery without angina pectoris: Secondary | ICD-10-CM | POA: Diagnosis present

## 2021-06-26 DIAGNOSIS — A4151 Sepsis due to Escherichia coli [E. coli]: Secondary | ICD-10-CM | POA: Diagnosis present

## 2021-06-26 DIAGNOSIS — H3530 Unspecified macular degeneration: Secondary | ICD-10-CM | POA: Diagnosis present

## 2021-06-26 DIAGNOSIS — Z888 Allergy status to other drugs, medicaments and biological substances status: Secondary | ICD-10-CM | POA: Diagnosis not present

## 2021-06-26 DIAGNOSIS — Z882 Allergy status to sulfonamides status: Secondary | ICD-10-CM | POA: Diagnosis not present

## 2021-06-26 DIAGNOSIS — Z79899 Other long term (current) drug therapy: Secondary | ICD-10-CM | POA: Diagnosis not present

## 2021-06-26 DIAGNOSIS — Z66 Do not resuscitate: Secondary | ICD-10-CM | POA: Diagnosis present

## 2021-06-26 DIAGNOSIS — Z90710 Acquired absence of both cervix and uterus: Secondary | ICD-10-CM | POA: Diagnosis not present

## 2021-06-26 DIAGNOSIS — I10 Essential (primary) hypertension: Secondary | ICD-10-CM | POA: Diagnosis present

## 2021-06-26 DIAGNOSIS — Z8249 Family history of ischemic heart disease and other diseases of the circulatory system: Secondary | ICD-10-CM | POA: Diagnosis not present

## 2021-06-26 DIAGNOSIS — N390 Urinary tract infection, site not specified: Secondary | ICD-10-CM | POA: Diagnosis present

## 2021-06-26 DIAGNOSIS — H548 Legal blindness, as defined in USA: Secondary | ICD-10-CM | POA: Diagnosis present

## 2021-06-26 DIAGNOSIS — E785 Hyperlipidemia, unspecified: Secondary | ICD-10-CM | POA: Diagnosis present

## 2021-06-26 LAB — HEPATIC FUNCTION PANEL
ALT: 85 U/L — ABNORMAL HIGH (ref 0–44)
AST: 48 U/L — ABNORMAL HIGH (ref 15–41)
Albumin: 2.9 g/dL — ABNORMAL LOW (ref 3.5–5.0)
Alkaline Phosphatase: 180 U/L — ABNORMAL HIGH (ref 38–126)
Bilirubin, Direct: 0.3 mg/dL — ABNORMAL HIGH (ref 0.0–0.2)
Indirect Bilirubin: 0.6 mg/dL (ref 0.3–0.9)
Total Bilirubin: 0.9 mg/dL (ref 0.3–1.2)
Total Protein: 6.1 g/dL — ABNORMAL LOW (ref 6.5–8.1)

## 2021-06-26 LAB — BLOOD CULTURE ID PANEL (REFLEXED) - BCID2

## 2021-06-26 LAB — CBC
HCT: 34.9 % — ABNORMAL LOW (ref 36.0–46.0)
Hemoglobin: 11.8 g/dL — ABNORMAL LOW (ref 12.0–15.0)
MCH: 29.3 pg (ref 26.0–34.0)
MCHC: 33.8 g/dL (ref 30.0–36.0)
MCV: 86.6 fL (ref 80.0–100.0)
Platelets: 191 10*3/uL (ref 150–400)
RBC: 4.03 MIL/uL (ref 3.87–5.11)
RDW: 13.9 % (ref 11.5–15.5)
WBC: 21.1 10*3/uL — ABNORMAL HIGH (ref 4.0–10.5)
nRBC: 0 % (ref 0.0–0.2)

## 2021-06-26 LAB — PROTIME-INR
INR: 1.3 — ABNORMAL HIGH (ref 0.8–1.2)
Prothrombin Time: 15.6 seconds — ABNORMAL HIGH (ref 11.4–15.2)

## 2021-06-26 LAB — BASIC METABOLIC PANEL
Anion gap: 8 (ref 5–15)
BUN: 23 mg/dL (ref 8–23)
CO2: 23 mmol/L (ref 22–32)
Calcium: 8.8 mg/dL — ABNORMAL LOW (ref 8.9–10.3)
Chloride: 106 mmol/L (ref 98–111)
Creatinine, Ser: 0.75 mg/dL (ref 0.44–1.00)
GFR, Estimated: >60 mL/min (ref >60)
Glucose, Bld: 115 mg/dL — ABNORMAL HIGH (ref 70–99)
Potassium: 3.6 mmol/L (ref 3.5–5.1)
Sodium: 137 mmol/L (ref 135–145)

## 2021-06-26 LAB — VITAMIN B12: Vitamin B-12: 797 pg/mL (ref 180–914)

## 2021-06-26 MED ORDER — SODIUM CHLORIDE 0.9 % IV SOLN
2.0000 g | Freq: Every day | INTRAVENOUS | Status: AC
  Administered 2021-06-26 – 2021-06-28 (×3): 2 g via INTRAVENOUS
  Filled 2021-06-26 (×3): qty 20

## 2021-06-26 MED ORDER — MELATONIN 5 MG PO TABS
5.0000 mg | ORAL_TABLET | Freq: Every evening | ORAL | Status: DC | PRN
Start: 2021-06-26 — End: 2021-06-28
  Administered 2021-06-26 (×2): 5 mg via ORAL
  Filled 2021-06-26 (×3): qty 1

## 2021-06-26 MED ORDER — DOCUSATE SODIUM 100 MG PO CAPS
100.0000 mg | ORAL_CAPSULE | Freq: Two times a day (BID) | ORAL | Status: DC | PRN
  Administered 2021-06-26: 100 mg via ORAL
  Filled 2021-06-26: qty 1

## 2021-06-26 NOTE — Evaluation (Addendum)
Physical Therapy Evaluation Patient Details Name: BRALEIGH MASSOUD MRN: [id number] DOB: 12/07/1924 Today's Date: 06/26/2021  History of Present Illness  Pt is a 88 yo female that presented initially to urgent care, and then ED for AMS, fever. Workup showed sepsis secondard to UTI. PMH of bilateral macular degeneration, CAD, HTN.  Clinical Impression  Pt was alert and oriented x 4 and agreeable to PT, pt was found seated EOB at start of session and in recliner end of session. Pt's current living situation includes living alone in a 1 level condo w/ 3 steps to enter and has caregiver assist 4x/wk and use of meals of wheels. Pt reports no falls within the last 6 months. Pt tolerated session well with no adverse response. Pt was able to perform transfers with CGA and primarily needed cuing for hand placement due to limited vision. Pt was able to ambulate using RW, CGA and needing assist to steer direction of the walker, however did not require physical assist. Due to her visional deficits she is cautious during gait, no LOB noted. Pt will benefit from skilled PT in order to improve balance to decrease risk of falls and return to PLOF, current recommendation is HHPT.      Recommendations for follow up therapy are one component of a multi-disciplinary discharge planning process, led by the attending physician.  Recommendations may be updated based on patient status, additional functional criteria and insurance authorization.  Follow Up Recommendations Home health PT    Assistance Recommended at Discharge Intermittent Supervision/Assistance  Patient can return home with the following  A little help with walking and/or transfers;A little help with bathing/dressing/bathroom;Assistance with cooking/housework;Help with stairs or ramp for entrance    Equipment Recommendations None recommended by PT  Recommendations for Other Services       Functional Status Assessment Patient has had a recent decline in  their functional status and demonstrates the ability to make significant improvements in function in a reasonable and predictable amount of time.     Precautions / Restrictions Precautions Precautions: Fall Restrictions Weight Bearing Restrictions: No      Mobility  Bed Mobility               General bed mobility comments: Pt sitting EOB and left in recliner end of session    Transfers Overall transfer level: Needs assistance Equipment used: Rolling walker (2 wheels) Transfers: Sit to/from Stand Sit to Stand: Min guard           General transfer comment: Pt required physical assist for hand placement due to blind vision    Ambulation/Gait Ambulation/Gait assistance: Min guard Gait Distance (Feet): 30 Feet Assistive device: Rolling walker (2 wheels) Gait Pattern/deviations: Step-through pattern, Decreased step length - right, Decreased step length - left       General Gait Details: Pt with slow cautious gait due to decreased vision and requiring min assist to occassionally steer walker in proper direction.  Stairs            Wheelchair Mobility    Modified Rankin (Stroke Patients Only)       Balance Overall balance assessment: Needs assistance Sitting-balance support: No upper extremity supported, Feet supported Sitting balance-Leahy Scale: Good     Standing balance support: Reliant on assistive device for balance Standing balance-Leahy Scale: Fair                               Pertinent Vitals/Pain  Home Living Family/patient expects to be discharged to:: Private residence Living Arrangements: Alone Available Help at Discharge: Available PRN/intermittently;Other (Comment) (Caregiver assist 4x/wk) Type of Home: House ([place]) Home Access: Stairs to enter Entrance Stairs-Rails: Left Entrance Stairs-Number of Steps: 3   Home Layout: One level Home Equipment: Cane - single point;Rollator (4 wheels)      Prior Function  Prior Level of Function : Needs assist             Mobility Comments: Ambulates with SPC as visual assist ADLs Comments: Caregiver assist 4x/wk and meals on wheels     Hand Dominance        Extremity/Trunk Assessment   Upper Extremity Assessment Upper Extremity Assessment: Overall WFL for tasks assessed    Lower Extremity Assessment Lower Extremity Assessment: Overall WFL for tasks assessed       Communication      Cognition Arousal/Alertness: Awake/alert Behavior During Therapy: WFL for tasks assessed/performed Overall Cognitive Status: Within Functional Limits for tasks assessed                                          General Comments General comments (skin integrity, edema, etc.): Pt with overall good balance using RW and requiring extra time and effort to perform tasks.    Exercises     Assessment/Plan    PT Assessment Patient needs continued PT services  PT Problem List Decreased strength;Decreased activity tolerance;Decreased balance;Decreased mobility       PT Treatment Interventions Gait training;Therapeutic activities;Stair training;Functional mobility training;Therapeutic exercise;Balance training    PT Goals (Current goals can be found in the Care Plan section)  Acute Rehab PT Goals Patient Stated Goal: To return home to PLOF PT Goal Formulation: With patient Time For Goal Achievement: 07/09/21 Potential to Achieve Goals: Good    Frequency Min 2X/week     Co-evaluation               AM-PAC PT "6 Clicks" Mobility  Outcome Measure Help needed turning from your back to your side while in a flat bed without using bedrails?: A Little Help needed moving from lying on your back to sitting on the side of a flat bed without using bedrails?: A Little Help needed moving to and from a bed to a chair (including a wheelchair)?: A Little Help needed standing up from a chair using your arms (e.g., wheelchair or bedside chair)?: A  Little Help needed to walk in hospital room?: A Little Help needed climbing 3-5 steps with a railing? : A Little 6 Click Score: 18    End of Session Equipment Utilized During Treatment: Gait belt Activity Tolerance: Patient tolerated treatment well;No increased pain Patient left: in chair;with call bell/phone within reach;with chair alarm set Nurse Communication: Mobility status PT Visit Diagnosis: Difficulty in walking, not elsewhere classified (R26.2);Muscle weakness (generalized) (M62.81)    Time: 1013-1030 PT Time Calculation (min) (ACUTE ONLY): 17 min   Charges:   PT Evaluation $PT Eval Low Complexity: 1 Low          Turner Daniels, SPT  06/26/2021, 12:41 PM

## 2021-06-26 NOTE — Progress Notes (Signed)
[facility] [place] NAME: Janet Barker    MR#:  [id number]  DATE OF BIRTH:  Apr 09, 1924  SUBJECTIVE:   patient sitting at the edge of the bed. She is legally blind due to macular degeneration. Denies any complaints however did eat some breakfast. Came in with fever nausea no energy at home for few days. Found to have UTI with blood culture positive for E. coli. No family at bedside.   VITALS:  Blood pressure 123/60, pulse 78, temperature 98.3 F (36.8 C), resp. rate 20, height '5\' 2"'$  (1.575 m), weight 63.5 kg, SpO2 94 %.  PHYSICAL EXAMINATION:   GENERAL:  88 y.o.-year-old patient lying in the bed with no acute distress. Legally blind LUNGS: Normal breath sounds bilaterally, no wheezing, rales, rhonchi.  CARDIOVASCULAR: S1, S2 normal. No murmurs, rubs, or gallops.  ABDOMEN: Soft, nontender, nondistended. Bowel sounds present.  EXTREMITIES: No  edema b/l.    NEUROLOGIC: nonfocal  patient is alert and awake SKIN: No obvious rash, lesion, or ulcer.   LABORATORY PANEL:  CBC Recent Labs  Lab 06/26/21 0529  WBC 21.1*  HGB 11.8*  HCT 34.9*  PLT 191    Chemistries  Recent Labs  Lab 06/26/21 0529  NA 137  K 3.6  CL 106  CO2 23  GLUCOSE 115*  BUN 23  CREATININE 0.75  CALCIUM 8.8*  AST 48*  ALT 85*  ALKPHOS 180*  BILITOT 0.9   Cardiac Enzymes No results for input(s): TROPONINI in the last 168 hours. RADIOLOGY:  DG Chest Port 1 View  Result Date: 06/25/2021 CLINICAL DATA:  Weakness. EXAM: PORTABLE CHEST 1 VIEW COMPARISON:  None Available. FINDINGS: The heart size and mediastinal contours are within normal limits. Eventration of the right hemidiaphragm. Lungs are clear without evidence of focal consolidation or pleural effusion. Mild bilateral glenohumeral osteoarthritis. Thoracic spondylosis. No acute osseous abnormality. IMPRESSION: No active disease. Electronically Signed   By: Keane Police D.O.   On: 06/25/2021 14:17     Assessment and Plan JAIDENCE GEISLER is a 88 y.o. female  who, per clinic note dated 05/13/2021 has history of CAD, HLD, who presents to the emergency department today from urgent care where she presented because of concern for weakness and chills. Symptoms started 3 days ago.  At one time the patient felt she was starting get better but that she did not.  She has had generalized fatigue.  E. coli sepsis secondary to UTI -- came in with leukocytosis, fever, elevated lactic acid, LFTs and abnormal urine with positive blood culture -- IV Rocephin -- received IV fluids -- afebrile today  Leukocytosis -- secondary to UTI  macular degeneration/legally blind generalized weakness -- patient worked with physical therapy and seem to be doing near baseline for PT  Hypertension -- continue amlodipine and lisinopril for now      Procedures: none Family communication : son on the phone Consults : none CODE STATUS: DNR DVT Prophylaxis : Lovenox Level of care: Med-Surg Status is: Inpatient Remains inpatient appropriate because: IV antibiotics for E. coli sepsis  anticipate discharge 1-3 days    TOTAL TIME TAKING CARE OF THIS PATIENT: 35 minutes.  >50% time spent on counselling and coordination of care  Note: This dictation was prepared with Dragon dictation along with smaller phrase technology. Any transcriptional errors that result from this process are unintentional.  Fritzi Mandes M.D    [facility]   CC: Primary care physician; Latanya Maudlin,  NP

## 2021-06-26 NOTE — Evaluation (Signed)
Occupational Therapy Evaluation Patient Details Name: Janet Barker MRN: [id number] DOB: 05-11-1924 Today's Date: 06/26/2021   History of Present Illness Pt is a 88 yo female that presented initially to urgent care, and then ED for AMS, fever. Workup showed sepsis secondard to UTI. PMH of bilateral macular degeneration, CAD, HTN.   Clinical Impression   Patient presenting with decreased Ind in self care,balance, functional mobility/transfers, endurance, and safety awareness. Patient reports being mod I at baseline in her own environment with use of cane. She does have caregiver to assist with some IADLs and meals on wheels. She is very functional and independent in her own home. She obviously needs increased cuing and supervision in hospital secondary to unfamiliar environment but moving well.   Patient will benefit from acute OT to increase overall independence in the areas of ADLs, functional mobility,and safety awareness in order to safely discharge home.      Recommendations for follow up therapy are one component of a multi-disciplinary discharge planning process, led by the attending physician.  Recommendations may be updated based on patient status, additional functional criteria and insurance authorization.   Follow Up Recommendations  Home health OT    Assistance Recommended at Discharge Intermittent Supervision/Assistance  Patient can return home with the following A little help with walking and/or transfers;A little help with bathing/dressing/bathroom;Assist for transportation;Help with stairs or ramp for entrance;Assistance with cooking/housework    Functional Status Assessment  Patient has had a recent decline in their functional status and demonstrates the ability to make significant improvements in function in a reasonable and predictable amount of time.  Equipment Recommendations  None recommended by OT       Precautions / Restrictions Precautions Precautions:  Fall Restrictions Weight Bearing Restrictions: No      Mobility Bed Mobility               General bed mobility comments: Pt sitting EOB and left in recliner end of session    Transfers Overall transfer level: Needs assistance Equipment used: Rolling walker (2 wheels) Transfers: Sit to/from Stand Sit to Stand: Supervision                  Balance Overall balance assessment: Needs assistance Sitting-balance support: No upper extremity supported, Feet supported Sitting balance-Leahy Scale: Good     Standing balance support: During functional activity Standing balance-Leahy Scale: Fair Standing balance comment: able to stand with at least unilateral support, bilateral provided for comfort                           ADL either performed or assessed with clinical judgement   ADL Overall ADL's : Needs assistance/impaired                                       General ADL Comments: supervision for ambulation with RW and grooming in standing. Supervision mostly secondary to vision loss or safety in unfamiliar environment     Vision Patient Visual Report: No change from baseline              Pertinent Vitals/Pain Pain Assessment Pain Assessment: No/denies pain     Hand Dominance Right   Extremity/Trunk Assessment Upper Extremity Assessment Upper Extremity Assessment: Overall WFL for tasks assessed   Lower Extremity Assessment Lower Extremity Assessment: Defer to PT evaluation  Communication     Cognition Arousal/Alertness: Awake/alert Behavior During Therapy: WFL for tasks assessed/performed Overall Cognitive Status: Within Functional Limits for tasks assessed                                 General Comments: Pt is very pleasant and cooperative                Home Living Family/patient expects to be discharged to:: Private residence Living Arrangements: Alone Available Help at Discharge:  Available PRN/intermittently;Other (Comment) Type of Home: House Home Access: Stairs to enter [employer] of Steps: 3 Entrance Stairs-Rails: Left Home Layout: One level     Bathroom Shower/Tub: Occupational psychologist: Handicapped height Bathroom Accessibility: Yes   Home Equipment: [employer] - single point;Rollator (4 wheels)          Prior Functioning/Environment Prior Level of Function : Needs assist             Mobility Comments: Ambulates with SPC as visual assist ADLs Comments: Caregiver assist 4x/wk and meals on wheels. Pt reports performing self care independently and doing some home management tasks herself.        OT Problem List: Decreased activity tolerance;Decreased safety awareness;Impaired balance (sitting and/or standing)      OT Treatment/Interventions: Self-care/ADL training;Balance training;Therapeutic exercise;Therapeutic activities;Energy conservation;DME and/or AE instruction;Patient/family education;Manual therapy    OT Goals(Current goals can be found in the care plan section) Acute Rehab OT Goals Patient Stated Goal: to go home OT Goal Formulation: With patient Time For Goal Achievement: 07/10/21 Potential to Achieve Goals: Good ADL Goals Pt Will Perform Upper Body Bathing: with modified independence Pt Will Perform Lower Body Dressing: with modified independence Pt Will Transfer to Toilet: with modified independence Pt Will Perform Toileting - Clothing Manipulation and hygiene: with modified independence  OT Frequency: Min 2X/week       AM-PAC OT "6 Clicks" Daily Activity     Outcome Measure Help from another person eating meals?: None Help from another person taking care of personal grooming?: None Help from another person toileting, which includes using toliet, bedpan, or urinal?: A Little Help from another person bathing (including washing, rinsing, drying)?: A Little Help from another person to put on and taking off  regular upper body clothing?: None Help from another person to put on and taking off regular lower body clothing?: A Little 6 Click Score: 21   End of Session Equipment Utilized During Treatment: Rolling walker (2 wheels) Nurse Communication: Mobility status  Activity Tolerance: Patient tolerated treatment well Patient left: in bed;with call bell/phone within reach;with bed alarm set  OT Visit Diagnosis: Unsteadiness on feet (R26.81);Repeated falls (R29.6);Muscle weakness (generalized) (M62.81)                Time: 2549-8264 OT Time Calculation (min): 23 min Charges:  OT General Charges $OT Visit: 1 Visit OT Evaluation $OT Eval Moderate Complexity: 1 Mod OT Treatments $Self Care/Home Management : 8-22 mins  Darleen Crocker, MS, OTR/L , CBIS ascom [phone]  06/26/21, 1:51 PM

## 2021-06-26 NOTE — Progress Notes (Signed)
PHARMACY - PHYSICIAN COMMUNICATION CRITICAL VALUE ALERT - BLOOD CULTURE IDENTIFICATION (BCID)  BCID Results:  4 of 4 bottle w/ E coli, no resistance.  Pt is currently ordered Ceftriaxone 2 gm q24h.  Name of provider contacted: Rachael Fee, NP  Changes to prescribed antibiotics required: No change needed at this time.  Renda Rolls, PharmD, [facility] 06/26/2021 3:33 AM

## 2021-06-26 NOTE — Plan of Care (Signed)

## 2021-06-27 LAB — URINE CULTURE: Culture: >=100000 — AB

## 2021-06-27 MED ORDER — DOCUSATE SODIUM 100 MG PO CAPS
100.0000 mg | ORAL_CAPSULE | Freq: Two times a day (BID) | ORAL | Status: DC
  Administered 2021-06-27 – 2021-06-28 (×2): 100 mg via ORAL
  Filled 2021-06-27 (×2): qty 1

## 2021-06-27 MED ORDER — BISACODYL 5 MG PO TBEC
10.0000 mg | DELAYED_RELEASE_TABLET | Freq: Every day | ORAL | Status: DC | PRN
  Administered 2021-06-27: 10 mg via ORAL
  Filled 2021-06-27: qty 2

## 2021-06-27 NOTE — Progress Notes (Incomplete)
Mobility Specialist - Progress Note   06/27/21 1500  Mobility  Activity Ambulated with assistance in room;Ambulated with assistance to bathroom;Stood at bedside  Level of Assistance Contact guard assist, steadying assist  Assistive Device None  Distance Ambulated (ft) 10 ft  Activity Response Tolerated well  $Mobility charge 1 Mobility     Pre-mobility: HR, BP, SpO2 During mobility: HR, BP, SpO2 Post-mobility: HR, BP, SPO2

## 2021-06-27 NOTE — Progress Notes (Signed)
Mobility Specialist - Progress Note   06/27/21 1200  Mobility  Activity Ambulated with assistance in room;Stood at bedside;Transferred from bed to chair;Dangled on edge of bed  Level of Assistance Contact guard assist, steadying assist  Assistive Device Front wheel walker  Distance Ambulated (ft) 20 ft  Activity Response Tolerated well  $Mobility charge 1 Mobility     Pt in recliner upon arrival using RA. Verbal cues and hand placements throughout for safe transfer and ambulation d/t vision. Completes STS ModI, ambulates SBA-CGA for safe maneuvering around furniture. Stands at sink ~ 2 minutes for indep peri care and returns to bed with needs in reach and bed alarm set.  Merrily Brittle Mobility Specialist 06/27/21, 12:11 PM

## 2021-06-27 NOTE — TOC Initial Note (Signed)
Transition of Care [facility]) - Initial/Assessment Note    Patient Details  Name: Janet Barker MRN: [id number] Date of Birth: April 03, 1924  [facility] of [facility]) CM/SW Contact:    Magnus Ivan, LCSW Phone Number: 06/27/2021, 9:55 AM  Clinical Narrative:          CSW spoke with patient regarding DC planning. Patient lives alone. She has a caregiver 4 days a week who also provides transportation. PCP is Thornton Dales NP. [facility] [place]. No HH history. Patient has a cane and RW. Patient is agreeable to [facility]. Confirmed home address. Referral accepted by Merleen Nicely with [facility].          Expected Discharge Plan: [place] Barriers to Discharge: Continued Medical Work up   Patient Goals and CMS Choice Patient states their goals for this hospitalization and ongoing recovery are:: home with home health CMS Medicare.gov Compare Post Acute Care list provided to:: Patient Choice offered to / list presented to : Patient  Expected Discharge Plan and Services Expected Discharge Plan: [place]       Living arrangements for the past 2 months: Single Family Home                           HH Arranged: PT, OT [facility] Agency: [facility] Date [place]: 06/27/21   Representative spoke with at [place]: Merleen Nicely  Prior Living Arrangements/Services Living arrangements for the past 2 months: [place] Lives with:: Self Patient language and need for interpreter reviewed:: Yes Do you feel safe going back to the place where you live?: Yes      Need for Family Participation in Patient Care: Yes (Comment) Care giver support system in place?: Yes (comment) Current home services: DME Criminal Activity/Legal Involvement Pertinent to Current Situation/Hospitalization: No - Comment as needed  Activities of Daily Living Home Assistive Devices/Equipment: Cane (specify quad or straight) ADL Screening (condition at time of  admission) Patient's cognitive ability adequate to safely complete daily activities?: Yes Is the patient deaf or have difficulty hearing?: No Does the patient have difficulty seeing, even when wearing glasses/contacts?: Yes (Blind Totally) Does the patient have difficulty concentrating, remembering, or making decisions?: No Patient able to express need for assistance with ADLs?: Yes Does the patient have difficulty dressing or bathing?: No Independently performs ADLs?: Yes (appropriate for developmental age) Does the patient have difficulty walking or climbing stairs?: Yes Weakness of Legs: None Weakness of Arms/Hands: None  Permission Sought/Granted Permission sought to share information with : Facility Art therapist granted to share information with : Yes, Verbal Permission Granted     Permission granted to share info w AGENCY: [place] agencies        Emotional Assessment       Orientation: : Oriented to Self, Oriented to Place, Oriented to  Time, Oriented to Situation Alcohol / Substance Use: Not Applicable Psych Involvement: No (comment)  Admission diagnosis:  Lower urinary tract infectious disease [N39.0] Altered mental status [R41.82] Sepsis ([place]) [A41.9] Patient Active Problem List   Diagnosis Date Noted   Sepsis ([place]) 06/26/2021   Weakness 06/25/2021   Altered mental status 06/25/2021   Primary hypertension 06/25/2021   Bilateral blindness 06/25/2021   Sepsis secondary to UTI ([place]) 06/25/2021   Leukocytosis 06/25/2021   TIA (transient ischemic attack) 05/01/2015   PCP:  Latanya Maudlin, NP Pharmacy:   [id number]  [place], [place] [address] Phone: [phone] Fax: [phone]     Social Determinants of Health (SDOH) Interventions    Readmission Risk Interventions    06/27/2021    9:54 AM  Readmission Risk Prevention Plan  Post Dischage Appt Complete  Medication Screening Complete   Transportation Screening Complete

## 2021-06-27 NOTE — Progress Notes (Signed)
[place] [place] NAME: Janet Barker    MR#:  [id number]  DATE OF BIRTH:  Feb 21, 1924  SUBJECTIVE:  overall remains hemodynamically stable. Symptoms of constipation. Sitting out in the chair. Ate good breakfast. No family at bedside VITALS:  Blood pressure (!) 144/63, pulse 90, temperature 98.9 F (37.2 C), temperature source Oral, resp. rate 20, height '5\' 2"'$  (1.575 m), weight 63.5 kg, SpO2 93 %.  PHYSICAL EXAMINATION:   GENERAL:  88 y.o.-year-old patient lying in the bed with no acute distress. Legally blind LUNGS: Normal breath sounds bilaterally, no wheezing, rales, rhonchi.  CARDIOVASCULAR: S1, S2 normal. No murmurs, rubs, or gallops.  ABDOMEN: Soft, nontender, nondistended. Bowel sounds present.  EXTREMITIES: No  edema b/l.    NEUROLOGIC: nonfocal  patient is alert and awake SKIN: No obvious rash, lesion, or ulcer.   LABORATORY PANEL:  CBC Recent Labs  Lab 06/26/21 0529  WBC 21.1*  HGB 11.8*  HCT 34.9*  PLT 191     Chemistries  Recent Labs  Lab 06/26/21 0529  NA 137  K 3.6  CL 106  CO2 23  GLUCOSE 115*  BUN 23  CREATININE 0.75  CALCIUM 8.8*  AST 48*  ALT 85*  ALKPHOS 180*  BILITOT 0.9    Cardiac Enzymes No results for input(s): TROPONINI in the last 168 hours. RADIOLOGY:  DG Chest Port 1 View  Result Date: 06/25/2021 CLINICAL DATA:  Weakness. EXAM: PORTABLE CHEST 1 VIEW COMPARISON:  None Available. FINDINGS: The heart size and mediastinal contours are within normal limits. Eventration of the right hemidiaphragm. Lungs are clear without evidence of focal consolidation or pleural effusion. Mild bilateral glenohumeral osteoarthritis. Thoracic spondylosis. No acute osseous abnormality. IMPRESSION: No active disease. Electronically Signed   By: Keane Police D.O.   On: 06/25/2021 14:17    Assessment and Plan Janet Barker is a 88 y.o. female  who, per clinic note dated 05/13/2021 has history of CAD, HLD, who  presents to the emergency department today from urgent care where she presented because of concern for weakness and chills. Symptoms started 3 days ago.  At one time the patient felt she was starting get better but that she did not.  She has had generalized fatigue.  E. coli sepsis secondary to UTI -- came in with leukocytosis, fever, elevated lactic acid, LFTs and abnormal urine with positive blood culture -- IV Rocephin -- received IV fluids -- afebrile today  Leukocytosis -- secondary to UTI -- will check CBC in the morning.  macular degeneration/legally blind generalized weakness -- patient worked with physical therapy and seem to be doing near baseline for PT  Hypertension -- continue amlodipine and lisinopril for now  overall improving. If shows improvement with her labs and vitals will plan on discharging tomorrow. Patient agreeable.    Procedures: none Family communication : son on the phone 6/2 Consults : none CODE STATUS: DNR DVT Prophylaxis : Lovenox Level of care: Med-Surg Status is: Inpatient Remains inpatient appropriate because: IV antibiotics for E. coli sepsis  anticipate discharge 1-2 days    TOTAL TIME TAKING CARE OF THIS PATIENT: 35 minutes.  >50% time spent on counselling and coordination of care  Note: This dictation was prepared with Dragon dictation along with smaller phrase technology. Any transcriptional errors that result from this process are unintentional.  Fritzi Mandes M.D    [facility]   CC: Primary care physician; Latanya Maudlin, NP

## 2021-06-28 LAB — CBC
HCT: 34.3 % — ABNORMAL LOW (ref 36.0–46.0)
Hemoglobin: 11.8 g/dL — ABNORMAL LOW (ref 12.0–15.0)
MCH: 29 pg (ref 26.0–34.0)
MCHC: 34.4 g/dL (ref 30.0–36.0)
MCV: 84.3 fL (ref 80.0–100.0)
Platelets: 202 10*3/uL (ref 150–400)
RBC: 4.07 MIL/uL (ref 3.87–5.11)
RDW: 14.1 % (ref 11.5–15.5)
WBC: 12.5 10*3/uL — ABNORMAL HIGH (ref 4.0–10.5)
nRBC: 0 % (ref 0.0–0.2)

## 2021-06-28 LAB — CULTURE, BLOOD (ROUTINE X 2): Special Requests: ADEQUATE

## 2021-06-28 MED ORDER — CEPHALEXIN 500 MG PO CAPS: 500.0000 mg | ORAL_CAPSULE | Freq: Four times a day (QID) | ORAL | Status: DC

## 2021-06-28 MED ORDER — CEPHALEXIN 500 MG PO CAPS: 500.0000 mg | ORAL_CAPSULE | Freq: Four times a day (QID) | ORAL | 0 refills | Status: AC

## 2021-06-28 NOTE — TOC Transition Note (Signed)
Transition of Care [facility]) - CM/SW Discharge Note   Patient Details  Name: Janet Barker MRN: [id number] Date of Birth: 19-Aug-1924  [facility] of [facility] CM/SW Contact:  Magnus Ivan, LCSW Phone Number: 06/28/2021, 9:15 AM   Clinical Narrative:   Patient has orders to DC home today. CSW notified Merleen Nicely with [facility]. [facility] to follow for home health.     Final next level of care: [place] Barriers to Discharge: Barriers Resolved   Patient Goals and CMS Choice Patient states their goals for this hospitalization and ongoing recovery are:: home with home health CMS Medicare.gov Compare Post Acute Care list provided to:: Patient Choice offered to / list presented to : Patient  Discharge Placement                       Discharge Plan and Services                          HH Arranged: PT, OT [facility] Agency: [facility] Date [place]: 06/28/21   Representative spoke with at [facility]: [place] Interventions     Readmission Risk Interventions    06/27/2021    9:54 AM  Readmission Risk Prevention Plan  Post Dischage Appt Complete  Medication Screening Complete  Transportation Screening Complete

## 2021-06-28 NOTE — Discharge Summary (Signed)
Physician Discharge Summary   Patient: Janet Barker MRN: [id number] DOB: 05/23/1924  Admit date:     06/25/2021  Discharge date: 06/28/21  Discharge Physician: Fritzi Mandes   PCP: Latanya Maudlin, NP   Recommendations at discharge:   F/u PCP in 1-2 weeks  Discharge Diagnoses: Ecoli Sepsis E coli UTI  Hospital Course:  Janet Barker is a 88 y.o. female  who, per clinic note dated 05/13/2021 has history of CAD, HLD, who presents to the emergency department today from urgent care where she presented because of concern for weakness and chills. Symptoms started 3 days ago.  At one time the patient felt she was starting get better but that she did not.  She has had generalized fatigue.   E. coli sepsis secondary to UTI -- came in with leukocytosis, fever, elevated lactic acid, LFTs and abnormal urine with positive blood culture -- IV Rocephin--change to po keflex -- received IV fluids -- afebrile   Leukocytosis -- secondary to UTI -- 21K--12K   macular degeneration/legally blind generalized weakness -- patient worked with physical therapy and seem to be doing near baseline for PT   Hypertension -- continue amlodipine,lisinopril and resume clonidine also   overall improving.d/c home with HH. Patient agreeable.d/w Son Janet Barker          Disposition: Home health Diet recommendation:  Discharge Diet Orders (From admission, onward)     Start     Ordered   06/28/21 0000  Diet - low sodium heart healthy        06/28/21 0908           Cardiac diet DISCHARGE MEDICATION: Allergies as of 06/28/2021       Reactions   Statins Other (See Comments)   Reaction: Muscle Pains   Sulfa Antibiotics Anxiety        Medication List     STOP taking these medications    hydrochlorothiazide 12.5 MG capsule Commonly known as: MICROZIDE       TAKE these medications    amLODipine 5 MG tablet Commonly known as: NORVASC Take 1 tablet by mouth 2 (two) times daily.    aspirin EC 81 MG tablet Take 81 mg by mouth daily. Swallow whole.   cephALEXin 500 MG capsule Commonly known as: KEFLEX Take 1 capsule (500 mg total) by mouth 4 (four) times daily for 6 days. Start taking on: June 29, 2021   cloNIDine 0.1 MG tablet Commonly known as: CATAPRES Take 0.1 mg by mouth 2 (two) times daily.   dipyridamole-aspirin 200-25 MG 12hr capsule Commonly known as: AGGRENOX Take 1 capsule by mouth 2 (two) times daily.   Klor-Con 10 10 MEQ tablet Generic drug: potassium chloride Take 10 mEq by mouth 2 (two) times daily.   lisinopril 40 MG tablet Commonly known as: ZESTRIL Take 1 tablet by mouth daily.        Follow-up Information     Latanya Maudlin, NP. Call.   Specialty: Family Medicine Why: family to call and make appt in 1-2 weeks Contact information: [place] [address] [phone]                Discharge Exam: Danley Danker Weights   06/25/21 1311  Weight: 63.5 kg     Condition at discharge: fair  The results of significant diagnostics from this hospitalization (including imaging, microbiology, ancillary and laboratory) are listed below for reference.   Imaging Studies: DG Chest Port 1 View  Result Date:  06/25/2021 CLINICAL DATA:  Weakness. EXAM: PORTABLE CHEST 1 VIEW COMPARISON:  None Available. FINDINGS: The heart size and mediastinal contours are within normal limits. Eventration of the right hemidiaphragm. Lungs are clear without evidence of focal consolidation or pleural effusion. Mild bilateral glenohumeral osteoarthritis. Thoracic spondylosis. No acute osseous abnormality. IMPRESSION: No active disease. Electronically Signed   By: Keane Police D.O.   On: 06/25/2021 14:17    Microbiology: Results for orders placed or performed during the hospital encounter of 06/25/21  Blood culture (routine x 2)     Status: Abnormal   Collection Time: 06/25/21  3:11 PM   Specimen: BLOOD  Result Value Ref Range Status    Specimen Description   Final    BLOOD RIGHT ANTECUBITAL Performed at [facility], [address] [place], [place] [address]    Special Requests   Final    BOTTLES DRAWN AEROBIC AND ANAEROBIC Blood Culture results may not be optimal due to an inadequate volume of blood received in culture bottles Performed at [facility], [address], [place], [place] [address]    Culture  Setup Time   Final    GRAM NEGATIVE RODS IN BOTH AEROBIC AND ANAEROBIC BOTTLES CRITICAL RESULT CALLED TO, READ BACK BY AND VERIFIED WITH: NATHAN BELUE '@0213'$  ON 06/26/21 SKL GRAM STAIN REVIEWED-AGREE WITH RESULT Performed at [facility], [place] [address], [place], [place] [address]    Culture ESCHERICHIA COLI (A)  Final   Report Status 06/28/2021 FINAL  Final   Organism ID, Bacteria ESCHERICHIA COLI  Final      Susceptibility   Escherichia coli - MIC*    AMPICILLIN <=2 SENSITIVE Sensitive     CEFAZOLIN <=4 SENSITIVE Sensitive     CEFEPIME <=0.12 SENSITIVE Sensitive     CEFTAZIDIME <=1 SENSITIVE Sensitive     CEFTRIAXONE <=0.25 SENSITIVE Sensitive     CIPROFLOXACIN <=0.25 SENSITIVE Sensitive     GENTAMICIN <=1 SENSITIVE Sensitive     IMIPENEM <=0.25 SENSITIVE Sensitive     TRIMETH/SULFA <=20 SENSITIVE Sensitive     AMPICILLIN/SULBACTAM <=2 SENSITIVE Sensitive     PIP/TAZO <=4 SENSITIVE Sensitive     * ESCHERICHIA COLI  Urine Culture     Status: Abnormal   Collection Time: 06/25/21  3:11 PM   Specimen: Urine, Random  Result Value Ref Range Status   Specimen Description   Final    URINE, RANDOM Performed at [facility], [address] [place], [place] [address]    Special Requests   Final    NONE Performed at [facility], [address] [place], [place] [address]    Culture >=100,000 COLONIES/mL ESCHERICHIA COLI (A)  Final   Report Status 06/27/2021 FINAL  Final   Organism ID, Bacteria ESCHERICHIA COLI (A)  Final      Susceptibility   Escherichia coli -  MIC*    AMPICILLIN <=2 SENSITIVE Sensitive     CEFAZOLIN <=4 SENSITIVE Sensitive     CEFEPIME <=0.12 SENSITIVE Sensitive     CEFTRIAXONE <=0.25 SENSITIVE Sensitive     CIPROFLOXACIN <=0.25 SENSITIVE Sensitive     GENTAMICIN <=1 SENSITIVE Sensitive     IMIPENEM <=0.25 SENSITIVE Sensitive     NITROFURANTOIN <=16 SENSITIVE Sensitive     TRIMETH/SULFA <=20 SENSITIVE Sensitive     AMPICILLIN/SULBACTAM <=2 SENSITIVE Sensitive     PIP/TAZO <=4 SENSITIVE Sensitive     * >=100,000 COLONIES/mL ESCHERICHIA COLI  Blood Culture ID Panel (Reflexed)     Status:  Abnormal   Collection Time: 06/25/21  3:11 PM  Result Value Ref Range Status   Enterococcus faecalis NOT DETECTED NOT DETECTED Final   Enterococcus Faecium NOT DETECTED NOT DETECTED Final   Listeria monocytogenes NOT DETECTED NOT DETECTED Final   Staphylococcus species NOT DETECTED NOT DETECTED Final   Staphylococcus aureus (BCID) NOT DETECTED NOT DETECTED Final   Staphylococcus epidermidis NOT DETECTED NOT DETECTED Final   Staphylococcus lugdunensis NOT DETECTED NOT DETECTED Final   Streptococcus species NOT DETECTED NOT DETECTED Final   Streptococcus agalactiae NOT DETECTED NOT DETECTED Final   Streptococcus pneumoniae NOT DETECTED NOT DETECTED Final   Streptococcus pyogenes NOT DETECTED NOT DETECTED Final   A.calcoaceticus-baumannii NOT DETECTED NOT DETECTED Final   Bacteroides fragilis NOT DETECTED NOT DETECTED Final   Enterobacterales DETECTED (A) NOT DETECTED Final    Comment: Enterobacterales represent a large order of gram negative bacteria, not a single organism. CRITICAL RESULT CALLED TO, READ BACK BY AND VERIFIED WITH: NATHAN BELUE '@0213'$  ON 06/26/21 SKL    Enterobacter cloacae complex NOT DETECTED NOT DETECTED Final   Escherichia coli DETECTED (A) NOT DETECTED Final    Comment: CRITICAL RESULT CALLED TO, READ BACK BY AND VERIFIED WITH: NATHAN BELUE '@0213'$  ON 06/26/21 SKL    Klebsiella aerogenes NOT DETECTED NOT DETECTED Final    Klebsiella oxytoca NOT DETECTED NOT DETECTED Final   Klebsiella pneumoniae NOT DETECTED NOT DETECTED Final   Proteus species NOT DETECTED NOT DETECTED Final   Salmonella species NOT DETECTED NOT DETECTED Final   Serratia marcescens NOT DETECTED NOT DETECTED Final   Haemophilus influenzae NOT DETECTED NOT DETECTED Final   Neisseria meningitidis NOT DETECTED NOT DETECTED Final   Pseudomonas aeruginosa NOT DETECTED NOT DETECTED Final   Stenotrophomonas maltophilia NOT DETECTED NOT DETECTED Final   Candida albicans NOT DETECTED NOT DETECTED Final   Candida auris NOT DETECTED NOT DETECTED Final   Candida glabrata NOT DETECTED NOT DETECTED Final   Candida krusei NOT DETECTED NOT DETECTED Final   Candida parapsilosis NOT DETECTED NOT DETECTED Final   Candida tropicalis NOT DETECTED NOT DETECTED Final   Cryptococcus neoformans/gattii NOT DETECTED NOT DETECTED Final   CTX-M ESBL NOT DETECTED NOT DETECTED Final   Carbapenem resistance IMP NOT DETECTED NOT DETECTED Final   Carbapenem resistance KPC NOT DETECTED NOT DETECTED Final   Carbapenem resistance NDM NOT DETECTED NOT DETECTED Final   Carbapenem resist OXA 48 LIKE NOT DETECTED NOT DETECTED Final   Carbapenem resistance VIM NOT DETECTED NOT DETECTED Final    Comment: Performed at [facility], [address] [place], [place] [address]  Blood culture (routine x 2)     Status: Abnormal   Collection Time: 06/25/21  3:16 PM   Specimen: BLOOD  Result Value Ref Range Status   Specimen Description   Final    BLOOD BLOOD RIGHT FOREARM Performed at [facility], [address], [place], [place] [address]    Special Requests   Final    BOTTLES DRAWN AEROBIC AND ANAEROBIC Blood Culture adequate volume Performed at [facility], [address], [place], [place] [address]    Culture  Setup Time   Final    CRITICAL VALUE NOTED.  VALUE IS CONSISTENT WITH PREVIOUSLY REPORTED AND CALLED VALUE. GRAM NEGATIVE RODS IN BOTH  AEROBIC AND ANAEROBIC BOTTLES GRAM STAIN REVIEWED-AGREE WITH RESULT    Culture (A)  Final    ESCHERICHIA COLI SUSCEPTIBILITIES PERFORMED ON PREVIOUS CULTURE WITHIN THE LAST 5 DAYS. Performed at [facility], [place] [address], [place], [place] [address]    Report Status 06/28/2021 FINAL  Final    Labs: CBC: Recent Labs  Lab 06/25/21 1332 06/26/21 0529 06/28/21 0442  WBC 21.0* 21.1* 12.5*  HGB 12.4 11.8* 11.8*  HCT 37.2 34.9* 34.3*  MCV 87.7 86.6 84.3  PLT 229 191 782   Basic Metabolic Panel: Recent Labs  Lab 06/25/21 1332 06/26/21 0529  NA 131* 137  K 4.8 3.6  CL 98 106  CO2 24 23  GLUCOSE 130* 115*  BUN 32* 23  CREATININE 1.00 0.75  CALCIUM 9.4 8.8*   Liver Function Tests: Recent Labs  Lab 06/25/21 1332 06/26/21 0529  AST 79* 48*  ALT 124* 85*  ALKPHOS 191* 180*  BILITOT 1.1 0.9  PROT 6.8 6.1*  ALBUMIN 3.3* 2.9*   CBG: No results for input(s): GLUCAP in the last 168 hours.  Discharge time spent: greater than 30 minutes.  Signed: Fritzi Mandes, MD [facility] 06/28/2021

## 2022-04-27 ENCOUNTER — Other Ambulatory Visit: Payer: Self-pay | Admitting: Surgery

## 2022-04-27 ENCOUNTER — Other Ambulatory Visit: Payer: Self-pay

## 2022-04-27 ENCOUNTER — Encounter
Admission: RE | Admit: 2022-04-27 | Discharge: 2022-04-27 | Disposition: A | Discharge status: Home / Self Care | Payer: Medicare Other | Source: Ambulatory Visit | Attending: Surgery | Admitting: Surgery

## 2022-04-27 HISTORY — DX: Sepsis, unspecified organism: A41.9

## 2022-04-27 HISTORY — DX: Peripheral vascular disease, unspecified: I73.9

## 2022-04-27 HISTORY — DX: Hyperlipidemia, unspecified: E78.5

## 2022-04-27 NOTE — Patient Instructions (Addendum)
Your procedure is scheduled on: Wednesday 04/28/22 To find out your arrival time, please call [phone] between [place] on:   Tuesday 04/27/22 Report to the Registration Desk on the 1st floor of the [facility]. Valet parking is available.  If your arrival time is 6:00 am, do not arrive before that time as the [facility] entrance doors do not open until 6:00 am.  REMEMBER: Instructions that are not followed completely may result in serious medical risk, up to and including death; or upon the discretion of your surgeon and anesthesiologist your surgery may need to be rescheduled.  Do not eat food after midnight the night before surgery.  No gum chewing or hard candies.  You may however, drink CLEAR liquids up to 2 hours before you are scheduled to arrive for your surgery. Do not drink anything within 2 hours of your scheduled arrival time.  Clear liquids include: - water  - apple juice without pulp - gatorade (not RED colors) - black coffee or tea (Do NOT add milk or creamers to the coffee or tea) Do NOT drink anything that is not on this list.  Type 1 and Type 2 diabetics should only drink water.  One week prior to surgery: Stop Anti-inflammatories (NSAIDS) such as Advil, Aleve, Ibuprofen, Motrin, Naproxen, Naprosyn and Aspirin based products such as Excedrin, Goody's Powder, BC Powder. You may however, continue to take Tylenol if needed for pain up until the day of surgery.  Stop ANY OVER THE COUNTER supplements until after surgery.  Continue taking all prescribed medications.   TAKE ONLY THESE MEDICATIONS THE MORNING OF SURGERY WITH A SIP OF WATER:  amLODipine (NORVASC) 5 MG tablet  cloNIDine (CATAPRES) 0.1 MG tablet   No Alcohol for 24 hours before or after surgery.  No Smoking including e-cigarettes for 24 hours before surgery.  No chewable tobacco products for at least 6 hours before surgery.  No nicotine patches on the day of surgery.  Do not use any  "recreational" drugs for at least a week (preferably 2 weeks) before your surgery.  Please be advised that the combination of cocaine and anesthesia may have negative outcomes, up to and including death. If you test positive for cocaine, your surgery will be cancelled.  On the morning of surgery brush your teeth with toothpaste and water, you may rinse your mouth with mouthwash if you wish. Do not swallow any toothpaste or mouthwash.  Use CHG Soap or wipes as directed on instruction sheet. Shower using your regular soap.  Do not wear lotions, powders, or perfumes.   Do not shave body hair from the neck down 48 hours before surgery.  Wear comfortable clothing (specific to your surgery type) to the hospital.  Do not wear jewelry, make-up, hairpins, clips or nail polish.  Contact lenses, hearing aids and dentures may not be worn into surgery.  Do not bring valuables to the hospital. [facility] is not responsible for any missing/lost belongings or valuables.   Notify your doctor if there is any change in your medical condition (cold, fever, infection).  If you are being discharged the day of surgery, you will not be allowed to drive home. You will need a responsible individual to drive you home and stay with you for 24 hours after surgery.   If you are taking public transportation, you will need to have a responsible individual with you.  If you are being admitted to the hospital overnight, leave your suitcase in the car.  After surgery it may be brought to your room.  In case of increased patient census, it may be necessary for you, the patient, to continue your postoperative care in the Same Day Surgery department.  After surgery, you can help prevent lung complications by doing breathing exercises.  Take deep breaths and cough every 1-2 hours. Your doctor may order a device called an Incentive Spirometer to help you take deep breaths. When coughing or sneezing, hold a pillow firmly  against your incision with both hands. This is called "splinting." Doing this helps protect your incision. It also decreases belly discomfort.  Surgery Visitation Policy:  Patients undergoing a surgery or procedure may have two family members or support persons with them as long as the person is not COVID-19 positive or experiencing its symptoms.   Inpatient Visitation:    Visiting hours are 7 a.m. to 8 p.m. Up to four visitors are allowed at one time in a patient room. The visitors may rotate out with other people during the day. One designated support person (adult) may remain overnight.  Please call the [facility] Dept. at [phone] if you have any questions about these instructions.

## 2022-04-28 ENCOUNTER — Encounter: Payer: Self-pay | Admitting: Surgery

## 2022-04-28 ENCOUNTER — Other Ambulatory Visit: Payer: Self-pay

## 2022-04-28 ENCOUNTER — Encounter
Admission: RE | Disposition: A | Discharge status: Home / Self Care | Payer: Self-pay | Source: Ambulatory Visit | Attending: Surgery

## 2022-04-28 ENCOUNTER — Ambulatory Visit
Admission: RE | Admit: 2022-04-28 | Discharge: 2022-04-28 | Disposition: A | Discharge status: Home / Self Care | Payer: Medicare Other | Source: Ambulatory Visit | Attending: Surgery | Admitting: Surgery

## 2022-04-28 ENCOUNTER — Ambulatory Visit: Payer: Medicare Other | Admitting: Urgent Care

## 2022-04-28 ENCOUNTER — Ambulatory Visit: Payer: Medicare Other | Admitting: Registered Nurse

## 2022-04-28 DIAGNOSIS — G5601 Carpal tunnel syndrome, right upper limb: Secondary | ICD-10-CM | POA: Insufficient documentation

## 2022-04-28 DIAGNOSIS — I10 Essential (primary) hypertension: Secondary | ICD-10-CM | POA: Insufficient documentation

## 2022-04-28 DIAGNOSIS — H3530 Unspecified macular degeneration: Secondary | ICD-10-CM | POA: Diagnosis not present

## 2022-04-28 DIAGNOSIS — E782 Mixed hyperlipidemia: Secondary | ICD-10-CM | POA: Diagnosis not present

## 2022-04-28 DIAGNOSIS — I2510 Atherosclerotic heart disease of native coronary artery without angina pectoris: Secondary | ICD-10-CM | POA: Diagnosis not present

## 2022-04-28 DIAGNOSIS — H548 Legal blindness, as defined in USA: Secondary | ICD-10-CM | POA: Insufficient documentation

## 2022-04-28 DIAGNOSIS — I739 Peripheral vascular disease, unspecified: Secondary | ICD-10-CM | POA: Diagnosis not present

## 2022-04-28 HISTORY — PX: CARPAL TUNNEL RELEASE: SHX101

## 2022-04-28 SURGERY — RELEASE, CARPAL TUNNEL, ENDOSCOPIC
Anesthesia: Regional | Site: Wrist | Laterality: Right

## 2022-04-28 MED ORDER — FENTANYL CITRATE (PF) 100 MCG/2ML IJ SOLN: 25.0000 ug | INTRAMUSCULAR | Status: DC | PRN

## 2022-04-28 MED ORDER — FAMOTIDINE 20 MG PO TABS
20.0000 mg | ORAL_TABLET | Freq: Once | ORAL | Status: AC
  Administered 2022-04-28: 20 mg via ORAL

## 2022-04-28 MED ORDER — 0.9 % SODIUM CHLORIDE (POUR BTL) OPTIME
TOPICAL | Status: DC | PRN
  Administered 2022-04-28: 500 mL

## 2022-04-28 MED ORDER — FAMOTIDINE 20 MG PO TABS
ORAL_TABLET | ORAL | Status: AC
  Filled 2022-04-28: qty 1

## 2022-04-28 MED ORDER — DROPERIDOL 2.5 MG/ML IJ SOLN: 0.6250 mg | Freq: Once | INTRAMUSCULAR | Status: DC | PRN

## 2022-04-28 MED ORDER — METOCLOPRAMIDE HCL 5 MG/ML IJ SOLN: 5.0000 mg | Freq: Three times a day (TID) | INTRAMUSCULAR | Status: DC | PRN

## 2022-04-28 MED ORDER — ACETAMINOPHEN 10 MG/ML IV SOLN: 1000.0000 mg | Freq: Once | INTRAVENOUS | Status: DC | PRN

## 2022-04-28 MED ORDER — METOCLOPRAMIDE HCL 10 MG PO TABS: 5.0000 mg | ORAL_TABLET | Freq: Three times a day (TID) | ORAL | Status: DC | PRN

## 2022-04-28 MED ORDER — PROMETHAZINE HCL 25 MG/ML IJ SOLN: 6.2500 mg | INTRAMUSCULAR | Status: DC | PRN

## 2022-04-28 MED ORDER — BUPIVACAINE HCL (PF) 0.5 % IJ SOLN
INTRAMUSCULAR | Status: DC | PRN
  Administered 2022-04-28: 10 mL

## 2022-04-28 MED ORDER — ONDANSETRON HCL 4 MG PO TABS: 4.0000 mg | ORAL_TABLET | Freq: Four times a day (QID) | ORAL | Status: DC | PRN

## 2022-04-28 MED ORDER — BUPIVACAINE HCL (PF) 0.5 % IJ SOLN
INTRAMUSCULAR | Status: AC
  Filled 2022-04-28: qty 30

## 2022-04-28 MED ORDER — CHLORHEXIDINE GLUCONATE 0.12 % MT SOLN
OROMUCOSAL | Status: AC
  Filled 2022-04-28: qty 15

## 2022-04-28 MED ORDER — ONDANSETRON HCL 4 MG/2ML IJ SOLN: 4.0000 mg | Freq: Four times a day (QID) | INTRAMUSCULAR | Status: DC | PRN

## 2022-04-28 MED ORDER — SODIUM CHLORIDE 0.9 % IV SOLN: INTRAVENOUS | Status: DC

## 2022-04-28 MED ORDER — OXYCODONE HCL 5 MG/5ML PO SOLN: 5.0000 mg | Freq: Once | ORAL | Status: DC | PRN

## 2022-04-28 MED ORDER — CEFAZOLIN SODIUM-DEXTROSE 2-4 GM/100ML-% IV SOLN
2.0000 g | INTRAVENOUS | Status: AC
  Administered 2022-04-28: 2 g via INTRAVENOUS

## 2022-04-28 MED ORDER — LIDOCAINE HCL (PF) 0.5 % IJ SOLN
INTRAMUSCULAR | Status: DC | PRN
  Administered 2022-04-28: 40 mL via INTRAVENOUS

## 2022-04-28 MED ORDER — ACETAMINOPHEN 325 MG PO TABS: 325.0000 mg | ORAL_TABLET | Freq: Four times a day (QID) | ORAL | Status: DC | PRN

## 2022-04-28 MED ORDER — PROPOFOL 500 MG/50ML IV EMUL
INTRAVENOUS | Status: DC | PRN
  Administered 2022-04-28: 50 ug/kg/min via INTRAVENOUS

## 2022-04-28 MED ORDER — ORAL CARE MOUTH RINSE: 15.0000 mL | Freq: Once | OROMUCOSAL | Status: AC

## 2022-04-28 MED ORDER — LIDOCAINE HCL (PF) 0.5 % IJ SOLN
INTRAMUSCULAR | Status: AC
  Filled 2022-04-28: qty 50

## 2022-04-28 MED ORDER — CEFAZOLIN SODIUM-DEXTROSE 2-4 GM/100ML-% IV SOLN
INTRAVENOUS | Status: AC
  Filled 2022-04-28: qty 100

## 2022-04-28 MED ORDER — LACTATED RINGERS IV SOLN: INTRAVENOUS | Status: DC

## 2022-04-28 MED ORDER — OXYCODONE HCL 5 MG PO TABS: 5.0000 mg | ORAL_TABLET | Freq: Once | ORAL | Status: DC | PRN

## 2022-04-28 MED ORDER — CHLORHEXIDINE GLUCONATE 0.12 % MT SOLN
15.0000 mL | Freq: Once | OROMUCOSAL | Status: AC
  Administered 2022-04-28: 15 mL via OROMUCOSAL

## 2022-04-28 SURGICAL SUPPLY — 36 items
APL PRP STRL LF DISP 70% ISPRP (MISCELLANEOUS) ×1
BNDG CMPR 5X2 KNTD ELC UNQ LF (GAUZE/BANDAGES/DRESSINGS) ×1
BNDG CMPR 5X4 CHSV STRCH STRL (GAUZE/BANDAGES/DRESSINGS) ×1
BNDG COHESIVE 4X5 TAN STRL LF (GAUZE/BANDAGES/DRESSINGS) ×1 IMPLANT
BNDG ELASTIC 2INX 5YD STR LF (GAUZE/BANDAGES/DRESSINGS) ×1 IMPLANT
BNDG ESMARCH 4 X 12 STRL LF (GAUZE/BANDAGES/DRESSINGS) ×1
BNDG ESMARCH 4X12 STRL LF (GAUZE/BANDAGES/DRESSINGS) ×1 IMPLANT
CHLORAPREP W/TINT 26 (MISCELLANEOUS) ×1 IMPLANT
CORD BIP STRL DISP 12FT (MISCELLANEOUS) ×1 IMPLANT
CUFF TOURN SGL QUICK 18X4 (TOURNIQUET CUFF) ×1 IMPLANT
DRAPE SURG 17X11 SM STRL (DRAPES) ×1 IMPLANT
FORCEPS JEWEL BIP 4-3/4 STR (INSTRUMENTS) ×1 IMPLANT
GAUZE SPONGE 4X4 12PLY STRL (GAUZE/BANDAGES/DRESSINGS) ×1 IMPLANT
GAUZE XEROFORM 1X8 LF (GAUZE/BANDAGES/DRESSINGS) ×1 IMPLANT
GLOVE BIO SURGEON STRL SZ8 (GLOVE) ×1 IMPLANT
GLOVE INDICATOR 8.0 STRL GRN (GLOVE) ×1 IMPLANT
GOWN STRL REUS W/ TWL LRG LVL3 (GOWN DISPOSABLE) ×1 IMPLANT
GOWN STRL REUS W/ TWL XL LVL3 (GOWN DISPOSABLE) ×1 IMPLANT
GOWN STRL REUS W/TWL LRG LVL3 (GOWN DISPOSABLE) ×1
GOWN STRL REUS W/TWL XL LVL3 (GOWN DISPOSABLE) ×1
KIT CARPAL TUNNEL (MISCELLANEOUS) ×1
KIT ESCP INSRT D SLOT CANN KN (MISCELLANEOUS) ×1 IMPLANT
KIT TURNOVER KIT A (KITS) ×1 IMPLANT
MANIFOLD NEPTUNE II (INSTRUMENTS) ×1 IMPLANT
NS IRRIG 500ML POUR BTL (IV SOLUTION) ×1 IMPLANT
PACK EXTREMITY ARMC (MISCELLANEOUS) ×1 IMPLANT
SPLINT WRIST LG LT TX990309 (SOFTGOODS) IMPLANT
SPLINT WRIST LG RT TX900304 (SOFTGOODS) IMPLANT
SPLINT WRIST M LT TX990308 (SOFTGOODS) IMPLANT
SPLINT WRIST M RT TX990303 (SOFTGOODS) IMPLANT
SPLINT WRIST XL LT TX990310 (SOFTGOODS) IMPLANT
SPLINT WRIST XL RT TX990305 (SOFTGOODS) IMPLANT
STOCKINETTE IMPERVIOUS 9X36 MD (GAUZE/BANDAGES/DRESSINGS) ×1 IMPLANT
SUT PROLENE 4 0 PS 2 18 (SUTURE) ×1 IMPLANT
TRAP FLUID SMOKE EVACUATOR (MISCELLANEOUS) ×1 IMPLANT
WATER STERILE IRR 500ML POUR (IV SOLUTION) ×1 IMPLANT

## 2022-04-28 NOTE — Op Note (Signed)
04/28/2022  10:43 AM  Patient:   Janet Barker  Pre-Op Diagnosis:   Right carpal tunnel syndrome.  Post-Op Diagnosis:   Same.  Procedure:   Endoscopic right carpal tunnel release.  Surgeon:   Pascal Lux, MD  Anesthesia:   Bier block with IV sedation  Findings:   As above.  Complications:   None  EBL:   0 cc  Fluids:   300 cc crystalloid  TT:   26 minutes at 300 mmHg  Drains:   None  Closure:   4-0 Prolene interrupted sutures  Brief Clinical Note:   The patient is a 89 year old female with a long history of gradually worsening pain and paresthesias to her right hand. Her symptoms have progressed despite medications, activity modification, etc. Her history and examination are consistent with carpal tunnel syndrome, confirmed by EMG. The patient presents at this time for an endoscopic right carpal tunnel release.   Procedure:   The patient was brought into the operating room and lain in the supine position. After adequate IV sedation was achieved, a timeout was performed to verify the appropriate surgical site. A Bier block was then placed by the anesthesiologist.  The right upper extremity was exsanguinated with an Esmarch and the tourniquet inflated to 300 mmHg. The right hand and upper extremity were prepped with ChloraPrep solution before being draped sterilely. Preoperative antibiotics were administered. A repeat timeout was then performed to verify the appropriate surgical site.  An approximately 1.5-2 cm incision was made over the volar wrist flexion crease, centered over the palmaris longus tendon. The incision was carried down through the subcutaneous tissues with care taken to identify and protect any neurovascular structures. The distal forearm fascia was penetrated just proximal to the transverse carpal ligament. The soft tissues were released off the superficial and deep surfaces of the distal forearm fascia and this was released proximally for 3-4 cm under direct  visualization.  Attention was directed distally. The Soil scientist was passed beneath the transverse carpal ligament along the ulnar aspect of the carpal tunnel and used to release any adhesions as well as to remove any adherent synovial tissue before first the smaller then the larger of the two dilators were passed beneath the transverse carpal ligament along the ulnar margin of the carpal tunnel. The slotted cannula was introduced and the endoscope was placed into the slotted cannula and the undersurface of the transverse carpal ligament visualized. The distal margin of the transverse carpal ligament was marked by placing a 25-gauge needle percutaneously at [place] cardinal point so that it entered the distal portion of the slotted cannula. Under endoscopic visualization, the transverse carpal ligament was released from proximal to distal using the end-cutting blade. A second pass was performed to ensure complete release of the ligament. The adequacy of release was verified both endoscopically and by palpation using the freer elevator.  The wound was irrigated thoroughly with sterile saline solution before being closed using 4-0 Prolene interrupted sutures. A total of 10 cc of 0.5% plain Sensorcaine was injected in and around the incision before a sterile bulky dressing was applied to the wound. The patient was placed into a volar wrist splint before being awakened and returned to the recovery room in satisfactory condition after tolerating the procedure well.

## 2022-04-28 NOTE — H&P (Signed)
History of Present Illness:  Janet Barker is a 89 y.o. female who presents for evaluation and treatment of her chronic right hand numbness, pain, and paresthesias. The patient notes that her symptoms have been present for several years. She was first seen by Reche Dixon, PA-C, for this issue in December, 2023, and was diagnosed with carpal tunnel syndrome. She was given a Velcro wrist immobilizer to wear at night and sent for EMG/NCV studies of her right hand. This study confirmed the presence of "severe" carpal tunnel syndrome, so the patient was referred to me for further evaluation and treatment. The patient notes that her symptoms are quite aggravating, frequently interfering with her ability to perform simple tasks. She denies any pain in the wrist on today's visit and does not take any medications for this discomfort. However, because of the "itching" in her hand, she will apply Vaseline which she finds to be of some benefit. She has been wearing the wrist splint intermittently which she finds to be helpful as well.  Current Outpatient Medications: acetaminophen (TYLENOL) 500 MG tablet Take 1,000 mg by mouth every 8 (eight) hours as needed for Pain.  amLODIPine (NORVASC) 5 MG tablet TAKE 1 TABLET BY MOUTH TWICE DAILY 200 tablet 0  aspirin 81 MG EC tablet Take 81 mg by mouth once daily.  cloNIDine HCL (CATAPRES) 0.1 MG tablet Take 1 tablet (0.1 mg total) by mouth once daily Taking 1 90 tablet 3  lisinopriL (ZESTRIL) 40 MG tablet TAKE 1 TABLET BY MOUTH ONCE DAILY FOR BLOOD PRESSURE 90 tablet 3  meloxicam (MOBIC) 7.5 MG tablet Take 1 tablet (7.5 mg total) by mouth once daily 30 tablet 11  omega-3 fatty acids-vitamin E 1,000 mg Take 1 capsule by mouth once daily  potassium chloride (KLOR-CON) 10 MEQ ER tablet TAKE 1 TABLET BY MOUTH ONCE DAILY 90 tablet 1  VIT C/VIT E/LUTEIN/MIN/OMEGA-3 (OCUVITE ORAL) Take 1 capsule by mouth 2 (two) times daily  VITAMIN D3 ORAL Take 1,000 Units by mouth 2 (two) times  daily   Allergies:  Plavix [Clopidogrel] Other (See Comments)  Caused bleeding in eye  Statins-Hmg-Coa Reductase Inhibitors Muscle Pain  With pravastatin, simvastatin, atorvastatin  Sulfa (Sulfonamide Antibiotics) Other (See Comments)  gastritis  Zetia [Ezetimibe] Muscle Pain  Leg pain  Vesicare [Solifenacin] Hallucination   Past Medical History:  Benign essential hypertension 08/26/2014  Coronary artery disease 08/01/2013  status post PCI and stent placement of the LAD 10/07.  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  Sepsis secondary to UTI 06/25/2021  Last Assessment & Plan: Formatting of this note is different from the original. - Leukocytosis, lactic acid elevation, and initial reported altered mental status, source of urine - Blood cultures x2 have been collected and are in process - Urine cultures in process - Continue ceftriaxone 2 g IV daily - Patient is maintaining appropriate MAP and there is negative indications for septic shock at [facility]  Weakness 06/25/2021  Last Assessment & Plan: Formatting of this note is different from the original. - Check B12 - Fall precautions   Past Surgical History:  VITRECTOMY MECHANICAL W/FOCAL ENDOLASER PHOTOCOAGULATION PARS PLANA Right 05/19/2016  by Dr. Roosevelt Locks, for retinal detachment  HYSTERECTOMY TOTAL ABDOMINAL W/REMOVAL TUBES &/OR OVARIES Bilateral  TAH-BSO w/ appendectomy   Family History:  Stroke Mother  No Known Problems Father  sudden death  Stroke Sister  Osteoporosis (Thinning of  bones) Sister  Breast cancer Sister  Coronary Artery Disease (Blocked arteries around heart) Brother  Hyperlipidemia (Elevated cholesterol) Brother  Heart disease Brother  multiple surgeries  Stroke Brother  Pacemaker Brother  Skin cancer Brother  Macular degeneration Brother  No  Known Problems Maternal Grandmother  Other Maternal Grandfather  COD flu (1800's)  No Known Problems Paternal Grandmother  No Known Problems Paternal Grandfather  Heart disease Brother  Myocardial Infarction (Heart attack) Brother  Heart disease Brother  Heart disease Brother  High blood pressure (Hypertension) Brother  No Known Problems Son   Social History:   Socioeconomic History:  Marital status: Widowed  Number of children: 1  Occupational History  Occupation: retired  Tobacco Use  Smoking status: Never  Smokeless tobacco: Never  Vaping Use  Vaping Use: Never used  Substance and Sexual Activity  Alcohol use: No  Drug use: Not Currently  Sexual activity: Defer  Birth control/protection: Post-menopausal   Review of Systems:  A comprehensive 14 point ROS was performed, reviewed, and the pertinent orthopaedic findings are documented in the HPI.  Physical Exam: Vitals:  04/21/22 1026  BP: 134/78  Weight: 57.2 kg (126 lb)  Height: 152.4 cm (5')  PainSc: 0-No pain  PainLoc: Wrist   General/Constitutional: The patient appears to be well-nourished, well-developed, and in no acute distress. Neuro/Psych: Normal mood and affect, oriented to person, place and time. Eyes: The patient is blind. ENT: Unremarkable. Lymphatic: No palpable adenopathy. Respiratory: Lungs clear to auscultation, Normal chest excursion, No wheezes, and Non-labored breathing Cardiovascular: Regular rate and rhythm. No murmurs. and No edema, swelling or tenderness, except as noted in detailed exam. Integumentary: No impressive skin lesions present, except as noted in detailed exam. Musculoskeletal: Unremarkable, except as noted in detailed exam.  Right wrist/hand exam: Skin inspection of the right wrist and hand is notable for mild thenar and intrinsic atrophy but otherwise is unremarkable. No swelling, erythema, ecchymosis, abrasions, or other skin abnormalities are identified. She has no tenderness  to palpation over the dorsal or volar aspects of the wrist, nor does she have any tenderness to palpation over the dorsal or palmar aspects of the hand. She exhibits pain-free motion of the wrist without any catching. She is able after flex extend all digits fully without any pain or triggering, and is able to oppose her thumb to the base of the little finger. She is grossly neurovascularly intact to all digits, other than slightly decreased sensation objectively to the index fingertip. She exhibits a negative Phalen's sign, and has a negative Tinel's over the carpal tunnel.  EMG results:  A recent EMG/NCV is available for review and has been reviewed by myself. By report, the study demonstrates evidence of "severe (grade IV) carpal tunnel syndrome". This report was reviewed by myself and discussed with the patient.  Assessment: Carpal tunnel syndrome, right.   Plan: The treatment options were discussed with the patient and her friend. In addition, patient educational materials were provided regarding the diagnosis and treatment options. The patient is quite frustrated by her symptoms and functional limitations, and is ready to consider more aggressive treatment options. Therefore, I have recommended a surgical procedure, specifically an endoscopic right carpal tunnel release. The procedure was discussed with the patient, as were the potential risks (including bleeding, infection, nerve and/or blood vessel injury, persistent or recurrent pain/paresthesias, weakness of grip, need for further surgery, blood clots, strokes, heart attacks and/or arhythmias, pneumonia, etc.) and benefits. The patient states his/her understanding and wishes to proceed.  All of the patient's questions and concerns were answered. She can call any time with further concerns. She will follow up post-surgery, routine.    H&P reviewed and patient re-examined. No changes.

## 2022-04-28 NOTE — Anesthesia Preprocedure Evaluation (Signed)
Anesthesia Evaluation  Patient identified by MRN, date of birth, ID band Patient awake    Reviewed: Allergy & Precautions, H&P , NPO status , Patient's Chart, lab work & pertinent test results, reviewed documented beta blocker date and time   Airway Mallampati: II   Neck ROM: full    Dental  (+) Poor Dentition   Pulmonary neg pulmonary ROS   Pulmonary exam normal        Cardiovascular Exercise Tolerance: Poor hypertension, On Medications + CAD and + Peripheral Vascular Disease  Normal cardiovascular exam Rhythm:regular Rate:Normal     Neuro/Psych TIA negative psych ROS   GI/Hepatic negative GI ROS, Neg liver ROS,,,  Endo/Other  negative endocrine ROS    Renal/GU negative Renal ROS  negative genitourinary   Musculoskeletal   Abdominal   Peds  Hematology negative hematology ROS (+)   Anesthesia Other Findings Past Medical History: No date: Cancer     Comment:  skin ca No date: Coronary artery disease No date: HLD (hyperlipidemia) No date: Hypertension No date: Legally blind No date: Macular degeneration No date: Peripheral vascular disease No date: Sepsis secondary to UTI Past Surgical History: No date: ABDOMINAL HYSTERECTOMY No date: APPENDECTOMY 10/2005: CORONARY ANGIOPLASTY WITH STENT PLACEMENT No date: EYE MUSCLE SURGERY No date: EYE SURGERY BMI    Body Mass Index: 24.63 kg/m     Reproductive/Obstetrics negative OB ROS                             Anesthesia Physical Anesthesia Plan  ASA: 3  Anesthesia Plan: General and Bier Block and Bier Block-Lidocaine Only   Post-op Pain Management:    Induction:   PONV Risk Score and Plan: 4 or greater  Airway Management Planned:   Additional Equipment:   Intra-op Plan:   Post-operative Plan:   Informed Consent: I have reviewed the patients History and Physical, chart, labs and discussed the procedure including the  risks, benefits and alternatives for the proposed anesthesia with the patient or authorized representative who has indicated his/her understanding and acceptance.     Dental Advisory Given  Plan Discussed with: CRNA  Anesthesia Plan Comments:        Anesthesia Quick Evaluation

## 2022-04-28 NOTE — Anesthesia Procedure Notes (Signed)
Anesthesia Regional Block: Bier block (IV Regional)   Pre-Anesthetic Checklist: , timeout performed,  Correct Patient, Correct Site, Correct Laterality,,  Laterality: Right          Procedures:,,,,,, #20gu IV placed and double tourniquet utilized    Narrative:  Start time: 04/28/2022 10:10 AM End time: 04/28/2022 10:15 AM  Performed by: With CRNAs  Anesthesiologist: Molli Barrows, MD CRNA: Delphia Grates, RN

## 2022-04-28 NOTE — Discharge Instructions (Addendum)
Orthopedic discharge instructions: Keep dressing dry and intact. Keep hand elevated above heart level. May shower after dressing removed on postop day 4 (Sunday). Cover sutures with Band-Aids after drying off, then reapply Velcro splint. Apply ice to affected area frequently. Take ES Tylenol or pain medication as prescribed when needed.  Return for follow-up in 10-14 days or as scheduled.  AMBULATORY SURGERY  DISCHARGE INSTRUCTIONS   The drugs that you were given will stay in your system until tomorrow so for the next 24 hours you should not:  Drive an automobile Make any legal decisions Drink any alcoholic beverage   You may resume regular meals tomorrow.  Today it is better to start with liquids and gradually work up to solid foods.  You may eat anything you prefer, but it is better to start with liquids, then soup and crackers, and gradually work up to solid foods.   Please notify your doctor immediately if you have any unusual bleeding, trouble breathing, redness and pain at the surgery site, drainage, fever, or pain not relieved by medication.    Additional Instructions:        Please contact your physician with any problems or Same Day Surgery at [phone], Monday through Friday 6 am to 4 pm, or [place] [facility] number at [phone]. 

## 2022-04-28 NOTE — Anesthesia Postprocedure Evaluation (Signed)
Anesthesia Post Note  Patient: DEMARIA STEURER  Procedure(s) Performed: CARPAL TUNNEL RELEASE ENDOSCOPIC (Right: Wrist)  Patient location during evaluation: PACU Anesthesia Type: Bier Block Level of consciousness: awake and alert Pain management: pain level controlled Vital Signs Assessment: post-procedure vital signs reviewed and stable Respiratory status: spontaneous breathing, nonlabored ventilation, respiratory function stable and patient connected to nasal cannula oxygen Cardiovascular status: blood pressure returned to baseline and stable Postop Assessment: no apparent nausea or vomiting Anesthetic complications: no   No notable events documented.   Last Vitals:  Vitals:   04/28/22 1115 04/28/22 1128  BP: (!) 155/60 (!) 149/73  Pulse: 63 (!) 54  Resp: 19 17  Temp:  (!) 36.3 C  SpO2: 100% 100%    Last Pain:  Vitals:   04/28/22 1128  TempSrc: Temporal  PainSc: 0-No pain                 Molli Barrows

## 2022-04-28 NOTE — Anesthesia Procedure Notes (Signed)
Date/Time: 04/28/2022 10:00 AM  Performed by: Hilbert Odor, CRNAPre-anesthesia Checklist: Patient identified, Emergency Drugs available, Suction available and Patient being monitored Patient Re-evaluated:Patient Re-evaluated prior to induction Oxygen Delivery Method: Simple face mask Preoxygenation: Pre-oxygenation with 100% oxygen Induction Type: IV induction

## 2022-04-28 NOTE — Transfer of Care (Signed)
Immediate Anesthesia Transfer of Care Note  Patient: Janet Barker  Procedure(s) Performed: CARPAL TUNNEL RELEASE ENDOSCOPIC (Right: Wrist)  Patient Location: PACU  Anesthesia Type:MAC and Bier block  Level of Consciousness: awake and alert   Airway & Oxygen Therapy: Patient Spontanous Breathing and Patient connected to face mask oxygen  Post-op Assessment: Report given to RN and Post -op Vital signs reviewed and stable  Post vital signs: Reviewed and stable  Last Vitals:  Vitals Value Taken Time  BP 136/47 04/28/22 1045  Temp    Pulse 48 04/28/22 1047  Resp 18 04/28/22 1047  SpO2 100 % 04/28/22 1047  Vitals shown include unvalidated device data.  Last Pain:  Vitals:   04/28/22 0828  TempSrc: Temporal  PainSc: 0-No pain      Patients Stated Pain Goal: 0 ([id number])  Complications: No notable events documented.
